# Patient Record
Sex: Female | Born: 1975 | Race: White | Hispanic: No | Marital: Married | Smoking: Former smoker
Health system: Southern US, Community
[De-identification: ages and names within clinical notes are randomized; demographics above are authoritative.]

## PROBLEM LIST (undated history)

## (undated) DIAGNOSIS — Z789 Other specified health status: Secondary | ICD-10-CM

## (undated) DIAGNOSIS — R748 Abnormal levels of other serum enzymes: Secondary | ICD-10-CM

## (undated) DIAGNOSIS — IMO0002 Reserved for concepts with insufficient information to code with codable children: Secondary | ICD-10-CM

## (undated) HISTORY — PX: OTHER SURGICAL HISTORY: SHX169

## (undated) HISTORY — PX: CHOLECYSTECTOMY, LAPAROSCOPIC: SHX56

## (undated) HISTORY — PX: LEEP: SHX91

## (undated) HISTORY — PX: CERVICAL BIOPSY  W/ LOOP ELECTRODE EXCISION: SUR135

## (undated) HISTORY — DX: Abnormal levels of other serum enzymes: R74.8

---
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
## 1997-05-13 HISTORY — PX: TUBAL LIGATION: SHX77

## 1997-07-04 ENCOUNTER — Ambulatory Visit (HOSPITAL_COMMUNITY): Admission: RE | Admit: 1997-07-04 | Discharge: 1997-07-04 | Payer: Self-pay | Admitting: *Deleted

## 1997-07-10 ENCOUNTER — Inpatient Hospital Stay (HOSPITAL_COMMUNITY): Admission: AD | Admit: 1997-07-10 | Discharge: 1997-07-10 | Payer: Self-pay | Admitting: *Deleted

## 1997-07-24 ENCOUNTER — Inpatient Hospital Stay (HOSPITAL_COMMUNITY): Admission: AD | Admit: 1997-07-24 | Discharge: 1997-07-24 | Payer: Self-pay | Admitting: *Deleted

## 1997-07-27 ENCOUNTER — Ambulatory Visit (HOSPITAL_COMMUNITY): Admission: RE | Admit: 1997-07-27 | Discharge: 1997-07-27 | Payer: Self-pay | Admitting: *Deleted

## 1997-07-29 ENCOUNTER — Inpatient Hospital Stay (HOSPITAL_COMMUNITY): Admission: AD | Admit: 1997-07-29 | Discharge: 1997-08-01 | Payer: Self-pay | Admitting: Obstetrics & Gynecology

## 1997-09-12 ENCOUNTER — Encounter: Admission: RE | Admit: 1997-09-12 | Discharge: 1997-09-12 | Payer: Self-pay | Admitting: Family Medicine

## 1998-11-17 ENCOUNTER — Encounter: Admission: RE | Admit: 1998-11-17 | Discharge: 1998-11-17 | Payer: Self-pay | Admitting: Family Medicine

## 2004-05-13 HISTORY — PX: CHOLECYSTECTOMY: SHX55

## 2005-04-12 ENCOUNTER — Emergency Department (HOSPITAL_COMMUNITY): Admission: EM | Admit: 2005-04-12 | Discharge: 2005-04-12 | Payer: Self-pay | Admitting: Emergency Medicine

## 2005-04-18 ENCOUNTER — Encounter (INDEPENDENT_AMBULATORY_CARE_PROVIDER_SITE_OTHER): Payer: Self-pay | Admitting: Specialist

## 2005-04-18 ENCOUNTER — Ambulatory Visit (HOSPITAL_COMMUNITY): Admission: AD | Admit: 2005-04-18 | Discharge: 2005-04-19 | Payer: Self-pay | Admitting: General Surgery

## 2005-05-13 DIAGNOSIS — IMO0002 Reserved for concepts with insufficient information to code with codable children: Secondary | ICD-10-CM

## 2005-05-13 HISTORY — DX: Reserved for concepts with insufficient information to code with codable children: IMO0002

## 2005-09-09 ENCOUNTER — Ambulatory Visit (HOSPITAL_COMMUNITY): Admission: RE | Admit: 2005-09-09 | Discharge: 2005-09-09 | Payer: Self-pay | Admitting: Obstetrics and Gynecology

## 2006-07-17 ENCOUNTER — Other Ambulatory Visit: Admission: RE | Admit: 2006-07-17 | Discharge: 2006-07-17 | Payer: Self-pay | Admitting: Gynecology

## 2007-05-08 IMAGING — RF DG CHOLANGIOGRAM OPERATIVE
1 series · 4 of 4 positions shown · non-contrast
Comparison: none

CLINICAL DATA: Cholelithiasis

[Series 1: run · 4 of 74 frames shown]
[frame 12/74]
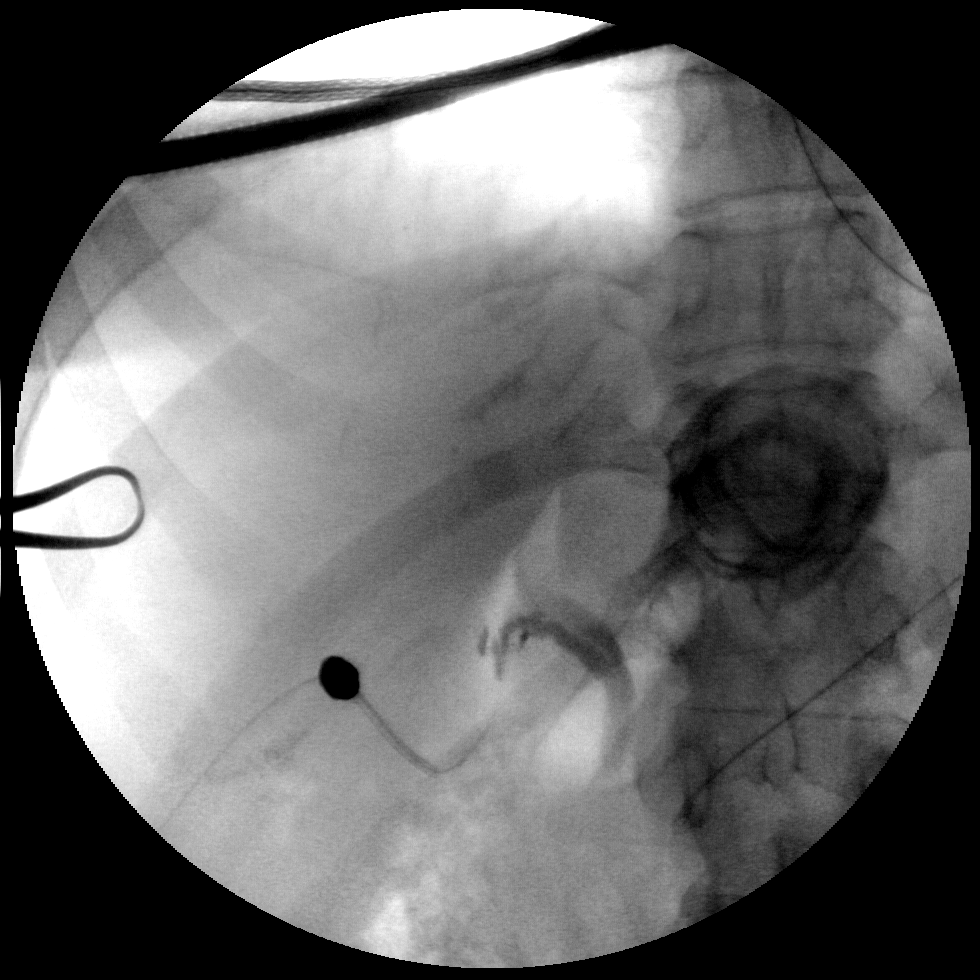
[frame 28/74]
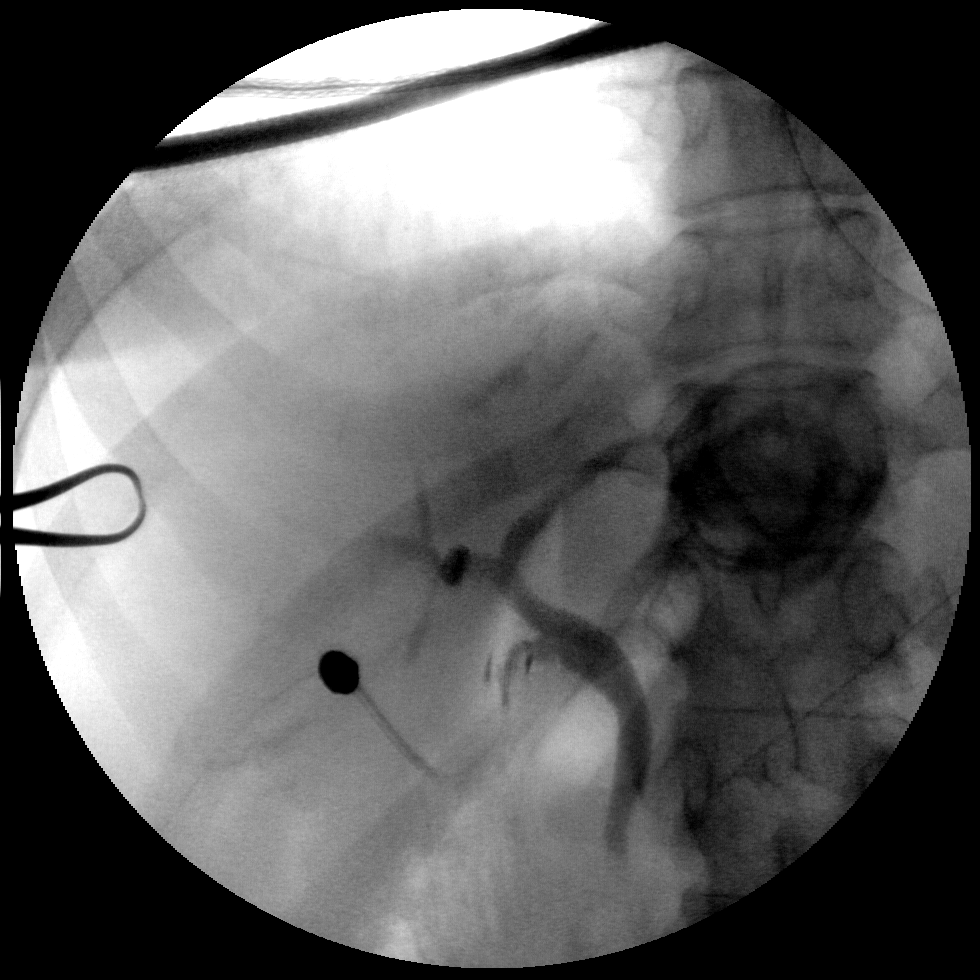
[frame 38/74]
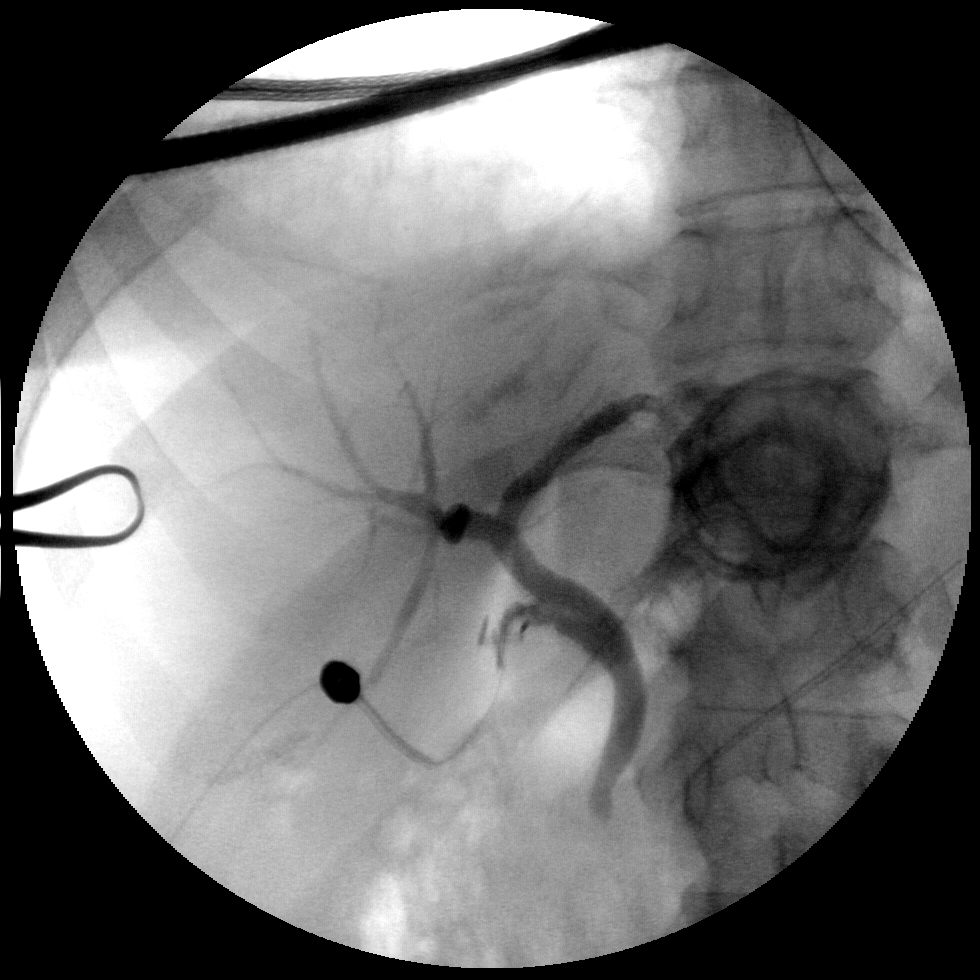
[frame 63/74]
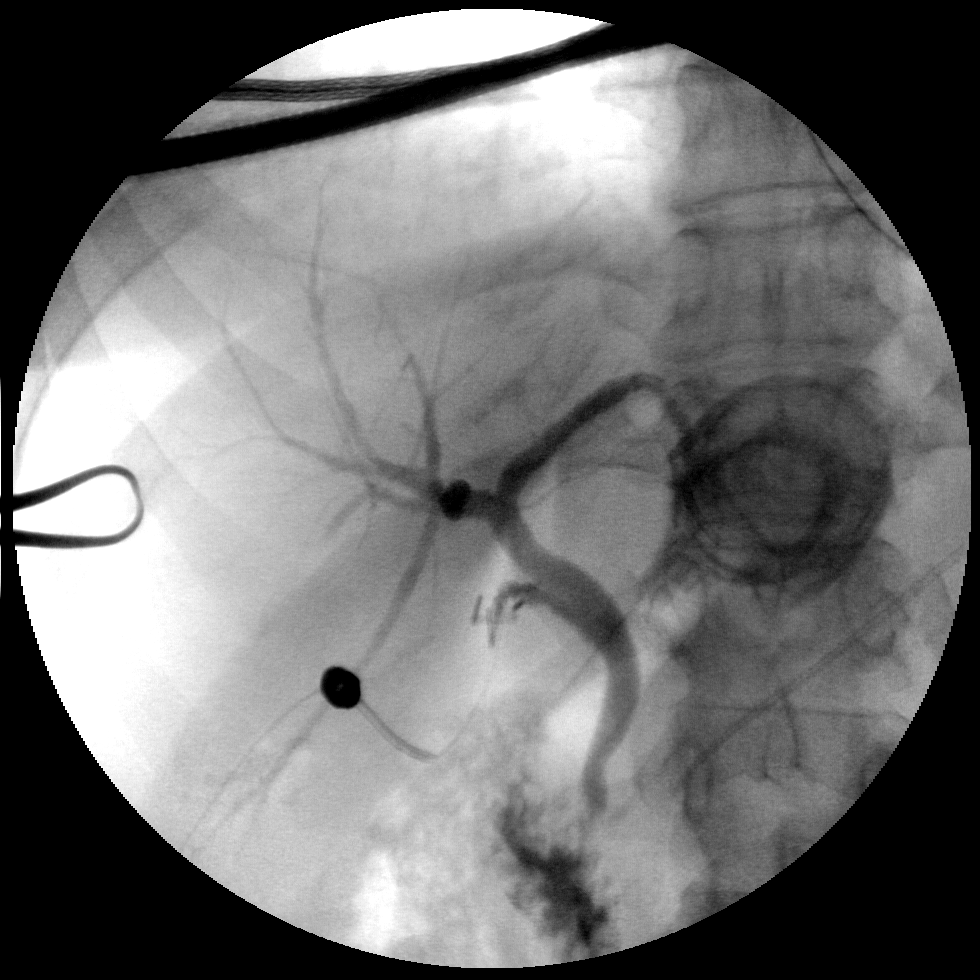

[4 of 4 positions shown; findings below may reference images not displayed]

INTRAOPERATIVE CHOLANGIOGRAM:

74  images from intraoperative C-arm fluoroscopy demonstrate  opacification of
the common bile duct. No filling defects to suggest retained stones. There is
incomplete evaluation of intrahepatic biliary tree, which appears decompressed
centrally. Contrast appears to flow on into decompressed duodenum.
IMPRESSION: 1. Negative for retained common duct stone

## 2007-06-05 ENCOUNTER — Inpatient Hospital Stay (HOSPITAL_COMMUNITY): Admission: AD | Admit: 2007-06-05 | Discharge: 2007-06-05 | Payer: Self-pay | Admitting: Obstetrics and Gynecology

## 2007-06-15 ENCOUNTER — Ambulatory Visit: Payer: Self-pay | Admitting: Cardiology

## 2007-06-15 LAB — CONVERTED CEMR LAB
Basophils Relative: 0 % (ref 0.0–1.0)
Eosinophils Relative: 0 % (ref 0.0–5.0)
HCT: 28.8 % — ABNORMAL LOW (ref 36.0–46.0)
Hemoglobin: 9 g/dL — ABNORMAL LOW (ref 12.0–15.0)
Lymphocytes Relative: 34 % (ref 12.0–46.0)
Neutrophils Relative %: 59 % (ref 43.0–77.0)
RDW: 14.9 % — ABNORMAL HIGH (ref 11.5–14.6)
TSH: 0.55 microintl units/mL (ref 0.35–5.50)
WBC: 12.2 10*3/uL — ABNORMAL HIGH (ref 4.5–10.5)

## 2007-06-18 ENCOUNTER — Inpatient Hospital Stay (HOSPITAL_COMMUNITY): Admission: AD | Admit: 2007-06-18 | Discharge: 2007-06-18 | Payer: Self-pay | Admitting: Obstetrics & Gynecology

## 2007-07-07 ENCOUNTER — Inpatient Hospital Stay (HOSPITAL_COMMUNITY): Admission: AD | Admit: 2007-07-07 | Discharge: 2007-07-10 | Payer: Self-pay | Admitting: Obstetrics & Gynecology

## 2010-02-26 ENCOUNTER — Other Ambulatory Visit: Admission: RE | Admit: 2010-02-26 | Discharge: 2010-02-26 | Payer: Self-pay | Admitting: Gynecology

## 2010-02-26 ENCOUNTER — Ambulatory Visit: Payer: Self-pay | Admitting: Gynecology

## 2010-08-03 ENCOUNTER — Ambulatory Visit (INDEPENDENT_AMBULATORY_CARE_PROVIDER_SITE_OTHER): Payer: BC Managed Care – PPO | Admitting: Gynecology

## 2010-08-03 DIAGNOSIS — N912 Amenorrhea, unspecified: Secondary | ICD-10-CM

## 2010-08-03 DIAGNOSIS — O26849 Uterine size-date discrepancy, unspecified trimester: Secondary | ICD-10-CM

## 2010-08-14 LAB — HIV ANTIBODY (ROUTINE TESTING W REFLEX)
HIV: NONREACTIVE
HIV: NONREACTIVE

## 2010-08-14 LAB — RPR: RPR: NONREACTIVE

## 2010-08-14 LAB — RUBELLA ANTIBODY, IGM: Rubella: IMMUNE

## 2010-08-14 LAB — ABO/RH

## 2010-08-14 LAB — ANTIBODY SCREEN: Antibody Screen: NEGATIVE

## 2010-09-25 NOTE — Assessment & Plan Note (Signed)
[facility]                            CARDIOLOGY OFFICE NOTE   NAME:[name], [name]                            MRN:          [id number]  DATE:06/15/2007                            DOB:          1975/12/01    I was asked by Dr. Edward Jolly to consult on [name] for tachycardia  and abnormal EKG.   She was having some shortness of breath and lower extremity edema and  was seen at [facility] on June 05, 2007.   She had an EKG which showed sinus tachycardia with poor progression in  the anterior precordium suggestive of an anterior wall infarct or old  inferior wall infarct.   She denies any chest pain but has had some dyspnea on exertion.  She  denies any true orthopnea.  She has a lot of edema at the end of the day  that goes down at night.   She is about 34 weeks' pregnant.  This is her fourth child.  She has had  no previous complications.  She has had no previous cardiac disease.   PAST MEDICAL HISTORY:  SHE IS INTOLERANT OF PERCOCET AND PENICILLIN.  She quit smoking in January of 2007.  She does not use any recreational  products or drink any alcohol.   PAST SURGICAL HISTORY:  1. Cholecystectomy in 2006.  2. Breast lumpectomy in 1996.  3. Tubal ligation 1999.  4. Tubal reversible in 2005.   FAMILY HISTORY:  Negative for premature coronary disease.   CURRENT MEDICATIONS:  Are none.   She is married and has 4 children.  Her husband is with her today.  She  is a Occupational hygienist doing data entry, filing, faxing, Spanish  translation.   REVIEW OF SYSTEMS:  Other than in the HPI is negative.   Her blood pressure is 122/70, heart rate is 116 and regular.  Her weight  is 225.  She is 5 feet, very pleasant.  HEENT:  Normocephalic, atraumatic.  PERRL.  Extraocular movements  intact.  Sclerae are clear.  Facial symmetry normal.  NECK:  Is supple.  Thyroid is not enlarged or tender.  Carotid upstrokes  were equal bilaterally  without bruits.  There was no obvious JVD.  LUNGS:  Were clear to auscultation.  HEART:  Reveals a poorly appreciated PMI.  She has a normal S1-S2 with  no gallop.  ABDOMINAL EXAM:  Is gravida.  EXTREMITIES:  Reveal trace edema.  Pulses are intact.  NEUROLOGIC EXAM:  Is intact.   ASSESSMENT:  1. Resting tachycardia.  Rule out significant anemia, thyroid      abnormality or just physiological changes of pregnancy.  2. Abnormal EKG.  This may just be from clockwise rotation of the      heart from diaphragmatic compression from pregnancy.  She needs a 2-      D echocardiogram particularly with her history of shortness of      breath and lower extremity edema.   PLAN:  1. Check CBC, TSH.  2. Two-D echocardiogram.   I will see her back  assuming her tests are relatively normal,  particularly her echocardiogram, several weeks after delivery.     Thomas C. Daleen Squibb, MD, [facility]  Electronically Signed    TCW/MedQ  DD: 06/15/2007  DT: 06/16/2007  Job #: [id number]   cc:   Randye Lobo, M.D.

## 2010-09-28 NOTE — Op Note (Signed)
[name], [name]                 ACCOUNT NO.:  [id number]   MEDICAL RECORD NO.:  [id number]          PATIENT TYPE:  OIB   LOCATION:  1609                         FACILITY:  [facility]   PHYSICIAN:  Ollen Gross. Vernell Morgans, M.D. DATE OF BIRTH:  [birth year]   DATE OF PROCEDURE:  04/18/2005  DATE OF DISCHARGE:  04/19/2005                                 OPERATIVE REPORT   PREOPERATIVE DIAGNOSIS:  Gallstones.   POSTOPERATIVE DIAGNOSIS:  Gallstones.   PROCEDURES:  Laparoscopic cholecystectomy with intraoperative cholangiogram.   SURGEON:  Dr. Carolynne Edouard.   ASSISTANT:  Dr. Jamey Ripa.   ANESTHESIA:  General endotracheal.   PROCEDURE:  After informed consent was obtained, the patient was brought to  the operating room, placed in supine position on the table. After adequate  induction of general anesthesia, the patient's abdomen was prepped with  Betadine and draped in the usual sterile manner. The area below the  umbilicus was infiltrated with 0.25% Marcaine, a small incision was made  with a 15 blade knife. This incision was carried down through the  subcutaneous tissue bluntly with a hemostat and Army-Nav[employer] retractors until  the linea alba was identified. The linea alba was incised with a15 blade  knife and each side was grasped with Kocher clamps and elevated anteriorly.  The preperitoneal space was probed bluntly with a hemostat until the  peritoneum was and opened access was gained to the abdominal cavity. A #0  Vicryl pursestring stitch was placed in the fascia surrounding the opening,  a Hasson cannula was placed through the opening and anchored in place with  the previously placed Vicryl pursestring stitch. The abdomen was then  insufflated with carbon dioxide without difficulty. The patient was placed  in reverse Trendelenburg and rotated slightly with the right side up. Next  the laparoscope was inserted through the Hasson cannula and the right upper  quadrant was inspected. The dome of the  gallbladder and liver were readily  identified. Next the epigastric region was infiltrated with 0.25% Marcaine.  A small incision was made with a 15 blade knife. A 10 mm port was then  placed bluntly through this incision into the abdominal cavity under direct  vision. Sites were then chosen laterally on the right side of the abdomen  for placement of the 5 mm ports. Each of these areas was infiltrated with  0.25% Marcaine. Small stab incisions were made with a 15 blade knife and 5  mm ports were placed bluntly through these incisions into the abdominal  cavity under direct vision. A blunt grasper was placed through the lateral  most 5 mm port and used to grasp the dome of the gallbladder and elevate it  anteriorly and superiorly. Another blunt grasper was placed through the  other 5 mm port and used to retract on the body and neck of the gallbladder.  A dissector was placed through the epigastric port and using the  electrocautery, the peritoneal reflection at the gallbladder neck was  opened, blunt dissection was then carried out in this area until the  gallbladder neck cystic duct junction was  readily identified and a good  window was created. A single clip was placed on the gallbladder neck, a  small ductotomy was made just below the clip with the laparoscopic scissors.  Next, a 14 gauge Angiocath was placed percutaneously through the anterior  abl wall under direct vision. A Reddick cholangiogram catheter was placed  through the Angiocath and flushed. The Reddick catheter was then placed  within the cystic duct and anchored in place with the clip, a cholangiogram  was obtained that showed no filling defects, good emptying into the duodenum  and adequate length on the cystic duct. The anchoring clip and catheters  were then removed from the patient and three clips were placed proximally on  the cystic duct and the duct was divided between the two sets of clips.  Posterior to this, the  cystic artery was identified and again dissected  bluntly in a circumferential manner until a good window was created. Two  clips were placed proximally and one distally in the artery and the artery  was divided between the two sets of clips. Next a laparoscopic hook cautery  device was used to separate the gallbladder from the liver bed. Prior to  completely detaching the gallbladder from the liver, the liver bed was  inspected and several small bleeding points were coagulated with the  electrocautery until the area was completely hemostatic. The gallbladder was  then detached the rest of the way from the liver bed without difficulty. A  laparoscopic bag was then inserted through the epigastric port. The  gallbladder was placed within the bag and the bag was sealed. The abdomen  was then irrigated with copious amounts of saline until the effluent was  clear. The laparoscope was then moved to the epigastric port and a  gallbladder grasper was placed through the Hasson cannula and used to grasp  the opening of the bag. The bag with the gallbladder was then removed  through the infraumbilical port without difficulty with the Hasson cannula.  The fascial defect was closed with the previously placed Vicryl pursestring  stitch as well as with another figure-of-eight #0 Vicryl stitch. The rest of  the ports were then removed under direct vision and were found to be  hemostatic. The gas was allowed to escape. The skin incisions were all  closed with interrupted 4-0 Monocryl subcuticular stitches. Benzoin and  Steri-Strips were applied. The patient tolerated the procedure well. At the  end of the case, all needle, sponge and instrument counts were correct. The  patient was then awakened and taken to recovery in stable condition.      Ollen Gross. Vernell Morgans, M.D.  Electronically Signed     PST/MEDQ  D:  04/21/2005  T:  04/22/2005  Job:  [id number]

## 2010-09-28 NOTE — H&P (Signed)
[name]                 ACCOUNT NO.:  [id number]   MEDICAL RECORD NO.:  [id number]          PATIENT TYPE:  AMB   LOCATION:  DAY                          FACILITY:  [facility]   PHYSICIAN:  Adolph Pollack, M.D.DATE OF BIRTH:  January 22, 1976   DATE OF ADMISSION:  04/18/2005  DATE OF DISCHARGE:                                HISTORY & PHYSICAL   CHIEF COMPLAINT:  Increasing right upper quadrant abdominal pain, chills and  sweats, and nausea.   HISTORY OF PRESENT ILLNESS:  Ms. [name] is an otherwise-healthy 35 year old  female who presented to the emergency room April 12, 2005, complaining of  severe right upper quadrant pain with nausea and vomiting. At that time she  was noted to have a gallstone impacted in the neck of the gallbladder. CBC  and liver function tests were normal, lipase was normal. No gallbladder wall  thickening to suggest cholecystitis. She was discharged on Dilaudid and told  to follow up in our office or the emergency department if symptoms got  worse. She has had worsening symptoms where now she is really unable to keep  much of anything down. She has had sweats and chills. She is feeling weak.  The Dilaudid is not managing her pain and she comes to our urgent office to  be seen.   PAST MEDICAL HISTORY:  Urinary tract infections.   PREVIOUS OPERATIONS:  1.  Bilateral tubal ligation.  2.  Reversal of bilateral tubal ligation.  3.  Breast biopsy.   ALLERGIES:  1.  PERCOCET causes a rash.  2.  PENICILLIN leads to swelling of her throat.   CURRENT MEDICATIONS:  Dilaudid every 4 hours p.r.n.   SOCIAL HISTORY:  She is married. Denies tobacco or alcohol use. Works for  the [employer].   REVIEW OF SYSTEMS:  CARDIOVASCULAR:  Denies heart disease or hypertension.  PULMONARY:  Denies any asthma, pneumonia, chronic lung disease. GI:  Denies  hepatitis, diarrhea, or bloody stool. GU:  Denies kidney stones but has had  multiple  urinary tract infections in the past, none over the past 7 years,  however. NEUROLOGIC:  No seizure disorders. ENDOCRINE:  Denies diabetes or  thyroid disease. Denies hypercholesterolemia. HEMATOLOGIC:  Denies any  bleeding disorders or blood clots.   PHYSICAL EXAMINATION:  GENERAL:  An ill-appearing female. She is about 5  feet 4 inches tall, weighs 163 pounds.  VITAL SIGNS:  Her temperature is 98.6, her pulse is 100, blood pressure is  126/80.  EYES:  Extraocular movements intact. No icterus is noted.  NECK:  Supple.  RESPIRATORY:  The breath sounds are equal and clear, respirations unlabored.  HEART:  Demonstrates a slightly increased rate with regular rhythm. I do not  hear a murmur.  ABDOMEN:  Soft but there is right upper quadrant tenderness and guarding and  a positive Murphy's sign. No obvious masses.  EXTREMITIES:  No cyanosis or edema.   IMPRESSION:  1.  Evolving acute cholecystitis.  2.  Dehydration - she states her urine is fairly dark with a strong,  concentrated odor to it. Not really having dysuria, however. Urinalysis      suggested many bacteria but also many squamous cells and only a few      white blood cells and not really consistent with a UTI back on December      1.   PLAN:  We will admit her to the hospital. Plan to start IV antibiotics. Plan  for a laparoscopic, possible open cholecystectomy. I discussed the procedure  and the risks to her. The risks include but are not limited to bleeding,  infection, wound healing problems, anesthetic risks, accidental injury of  the common bile duct/liver/intestines, bile leak, post cholecystectomy  diarrhea. She seems to understand this and agrees to proceed.      Adolph Pollack, M.D.  Electronically Signed     TJR/MEDQ  D:  04/18/2005  T:  04/18/2005  Job:  [id number]

## 2011-01-02 ENCOUNTER — Inpatient Hospital Stay (HOSPITAL_COMMUNITY)
Admission: AD | Admit: 2011-01-02 | Discharge: 2011-01-02 | Disposition: A | Discharge status: Home / Self Care | Payer: BC Managed Care – PPO | Source: Ambulatory Visit | Attending: Obstetrics and Gynecology | Admitting: Obstetrics and Gynecology

## 2011-01-02 ENCOUNTER — Encounter (HOSPITAL_COMMUNITY): Payer: Self-pay | Admitting: *Deleted

## 2011-01-02 DIAGNOSIS — O4700 False labor before 37 completed weeks of gestation, unspecified trimester: Secondary | ICD-10-CM | POA: Insufficient documentation

## 2011-01-02 HISTORY — DX: Reserved for concepts with insufficient information to code with codable children: IMO0002

## 2011-01-02 LAB — POCT FERN TEST: Fern Test: NEGATIVE

## 2011-01-02 NOTE — ED Provider Notes (Signed)
History     Chief Complaint  Patient presents with  . Contractions   HPI  [name], 35 y.o.  Patient of Dr. Berenda Morale presents with complaint of contractions.  Has hx of all 4 deliveries at 36 weeks.  Contractions began at 5:15pm, had a gush of fluid but no leaking since.  She is now [redacted]w[redacted]d.  Denies vaginal bleeding.  Contractions started q58minutes but now closer.  Reports uncomplicated pregnancy.   Stopped work, difficult to walk, on 8/3.    OB History    Grav Para Term Preterm Abortions TAB SAB Ect Mult Living   5 4 0 4 0 0 0 0 0 4       Past Medical History  Diagnosis Date  . Pregnancy induced hypertension 1996    G2  . Preterm labor   . Abnormal Pap smear 2007    colpo, leep    Past Surgical History  Procedure Date  . Breast biopsy 1996    left breast  . Cholecystectomy 2006  . Tubal ligation 1999    tubal reversal in 2006    No family history on file.  History  Substance Use Topics  . Smoking status: Never Smoker   . Smokeless tobacco: Never Used  . Alcohol Use: No    Allergies:  Allergies  Allergen Reactions  . Penicillins Swelling  . Percocet (Oxycodone-Acetaminophen) Other (See Comments)    migrains    No prescriptions prior to admission    Review of Systems  Constitutional: Negative for fever and chills.  Gastrointestinal: Negative for nausea and vomiting.  Genitourinary:       Contractions.  ? Loss of fluid   Physical Exam   Blood pressure 150/91, pulse 111, temperature 98.5 F (36.9 C), temperature source Oral, resp. rate 20, height 5\' 5"  (1.651 m), weight 244 lb (110.678 kg).  Physical Exam  Constitutional: She is oriented to person, place, and time. She appears well-developed and well-nourished.  GI:       gravid  Genitourinary: Vagina normal.       Cervical exam by Erin, Rn--1.5cm dilated, long -3.  Vaginal canal without blood or pooling of fluid.  Nl appearing white discharge without odor.   Neurological: She is alert and  oriented to person, place, and time.    MAU Course  Procedures  Slide for ferning obtained.  It is NEGATIVE>    FMS Reactive.  155, Mod variability.  Contracting q2-3 minutes apart.    MDM Erin to report findings to Dr. Ambrose Mantle.   Assessment and Plan    Sabrea Sankey,EVE M 01/02/2011, 8:44 PM

## 2011-01-02 NOTE — Progress Notes (Signed)
Pt in c/o contractions q6 minutes since 1715.  States she took a shower around 1700, had a warm gush of fluid when she got in shower.  Unsure if water broke.  Denies any leaking of fluid since then.  Denies any bleeding.  + FM.

## 2011-01-18 ENCOUNTER — Other Ambulatory Visit (HOSPITAL_COMMUNITY): Payer: Self-pay | Admitting: Obstetrics and Gynecology

## 2011-01-24 ENCOUNTER — Ambulatory Visit (HOSPITAL_COMMUNITY)
Admission: RE | Admit: 2011-01-24 | Discharge: 2011-01-24 | Disposition: A | Discharge status: Home / Self Care | Payer: BC Managed Care – PPO | Source: Ambulatory Visit | Attending: Obstetrics and Gynecology | Admitting: Obstetrics and Gynecology

## 2011-01-24 DIAGNOSIS — O3440 Maternal care for other abnormalities of cervix, unspecified trimester: Secondary | ICD-10-CM | POA: Insufficient documentation

## 2011-01-24 DIAGNOSIS — O3660X0 Maternal care for excessive fetal growth, unspecified trimester, not applicable or unspecified: Secondary | ICD-10-CM | POA: Insufficient documentation

## 2011-01-24 DIAGNOSIS — Z8751 Personal history of pre-term labor: Secondary | ICD-10-CM | POA: Insufficient documentation

## 2011-01-24 DIAGNOSIS — O409XX0 Polyhydramnios, unspecified trimester, not applicable or unspecified: Secondary | ICD-10-CM | POA: Insufficient documentation

## 2011-01-24 DIAGNOSIS — O09529 Supervision of elderly multigravida, unspecified trimester: Secondary | ICD-10-CM | POA: Insufficient documentation

## 2011-01-28 NOTE — H&P (Addendum)
[name] is a 35 y.o. female G5 P2204 at 39+ weeks gestation (EDD 02/04/11 by LMP c/w 14 week [place]) presents for a scheduled primary c-section by her request given a finding of probable macrosomia on [place] 01/24/11.  EFW on that scan was 4144 grams.  Pt had a prior difficult delivery of a 9#5oz infant with a fourth degree laceration and some residual rectal sphincter weakness and declines TOL as she feels this child is bigger.  Shje also wishes to have a tubal ligation for permanenet sterilization (she has had a BTL and reversal in the past).  Prenatal care has been complicated by polyhjydramnios, NST's done weekly.  History OB History    Grav Para Term Preterm Abortions TAB SAB Ect Mult Living   5 4 0 4 0 0 0 0 0 4     1995 NSVD 7#11oz 1996 NSVD 9#5oz 1999 NSVD 5#11oz 36 weeks 2009 NSVD 8#5oz  Past Medical History  Diagnosis Date  . Pregnancy induced hypertension 1996    G2  . Preterm labor   . Abnormal Pap smear 2007    colpo, leep   Past Surgical History  Procedure Date  . Breast biopsy 1996    left breast  . Cholecystectomy 2006  . Tubal ligation 1999    tubal reversal in 2006   Family History: in prenatals Social History:  reports that she has never smoked. She has never used smokeless tobacco. She reports that she does not drink alcohol or use illicit drugs.  ROS negative   BP 140/80 Maternal Exam:  Abdomen: Patient reports no abdominal tenderness. Surgical scars: low transverse.   Fundal height is 46cm.   Estimated fetal weight is 9 1/2-10 lbs.   Fetal presentation: vertex  Introitus: Normal vulva. Normal vagina.    Physical Exam  Constitutional: She appears well-developed.  Cardiovascular: Normal rate and regular rhythm.   Respiratory: Effort normal and breath sounds normal.  GI: Soft. Bowel sounds are normal.  Genitourinary: Vagina normal and uterus normal.  Psychiatric: Her behavior is normal.    Prenatal labs: ABO, Rh: A/Positive/-- (04/03 0000) Antibody:  Negative (04/03 0000) Rubella:  Immune RPR: Nonreactive (04/03 0000)  HBsAg: Negative (04/03 0000)  HIV: Non-reactive, Non-reactive (04/03 0000)  GBS: Negative (08/16 0000)  One hour GTT 154 3 hour glucola WNL Pt declined genetic screens  Assessment/Plan: Pt has elected a primary C-section for presumed macrosomia and a prior difficult delivery of a 9 lb infant.  She is aware of the possible 20% discrepancy in [place] projected and actual birth weight, but feels this child is larger than 9#.  SHe has a prior laparotomy scar from her tubal reversal.  We discussed the possible risks of bleeding infection and possible damage to bowel and bladder.  SHe also wishes permanent sterilization with a h/o BTL and reversal in the past.  We discussed if the tubes are too short, I may just have to do a fimbriectomynand is ok with that plan.  We discussed a possible failure rate of 1/100.  She desires to proceed.   Daveion Robar W 01/28/2011, 1:47 PM    Per pt no changes in dictated H&P. Huel Cote

## 2011-01-29 ENCOUNTER — Encounter (HOSPITAL_COMMUNITY): Payer: Self-pay

## 2011-01-29 ENCOUNTER — Telehealth (HOSPITAL_COMMUNITY): Payer: Self-pay | Admitting: *Deleted

## 2011-01-29 ENCOUNTER — Encounter (HOSPITAL_COMMUNITY)
Admission: RE | Admit: 2011-01-29 | Discharge: 2011-01-29 | Disposition: A | Discharge status: Home / Self Care | Payer: BC Managed Care – PPO | Source: Ambulatory Visit | Attending: Obstetrics and Gynecology | Admitting: Obstetrics and Gynecology

## 2011-01-29 HISTORY — DX: Other specified health status: Z78.9

## 2011-01-29 LAB — CBC
MCH: 22.7 pg — ABNORMAL LOW (ref 26.0–34.0)
MCV: 75.2 fL — ABNORMAL LOW (ref 78.0–100.0)
Platelets: 296 10*3/uL (ref 150–400)
RBC: 4.15 MIL/uL (ref 3.87–5.11)
RDW: 16.6 % — ABNORMAL HIGH (ref 11.5–15.5)
WBC: 11.9 10*3/uL — ABNORMAL HIGH (ref 4.0–10.5)

## 2011-01-29 LAB — SURGICAL PCR SCREEN: Staphylococcus aureus: NEGATIVE

## 2011-01-29 LAB — RPR: RPR Ser Ql: NONREACTIVE

## 2011-01-29 MED ORDER — GENTAMICIN SULFATE 40 MG/ML IJ SOLN
INTRAVENOUS | Status: AC
  Administered 2011-01-30: 100 mL via INTRAVENOUS
  Filled 2011-01-29: qty 2.5

## 2011-01-29 NOTE — Telephone Encounter (Signed)
Preadmission screen  

## 2011-01-29 NOTE — Patient Instructions (Signed)
   Your procedure is scheduled on:  Enter through the Main Entrance of [facility] at: Pick up the phone at the desk and dial [phone]  Please call this number if you have any problems the morning of surgery: [phone]  Remember: Do not eat food after midnight  Do not drink clear liquids after:midnight Take these medicines the morning of surgery with a SIP OF WATER:none  Do not wear jewelry, make-up, or FINGER nail polish Do not wear lotions, powders, or perfumes. Do not shave 48 hours prior to surgery. Do not bring valuables to the hospital. Leave suitcase in the car. After Surgery it may be brought to your room. For patients being admitted to the hospital, checkout time is 11:00am the day of discharge.  Patients discharged on the day of surgery will not be allowed to drive home.   Name and phone number of your driver:Jose- [phone]   Remember to use your hibiclens as instructed.

## 2011-01-30 ENCOUNTER — Encounter (HOSPITAL_COMMUNITY): Payer: Self-pay | Admitting: Anesthesiology

## 2011-01-30 ENCOUNTER — Inpatient Hospital Stay (HOSPITAL_COMMUNITY): Payer: BC Managed Care – PPO | Admitting: Anesthesiology

## 2011-01-30 ENCOUNTER — Inpatient Hospital Stay (HOSPITAL_COMMUNITY)
Admission: RE | Admit: 2011-01-30 | Discharge: 2011-02-02 | DRG: 371 | Disposition: A | Discharge status: Home / Self Care | Payer: BC Managed Care – PPO | Source: Ambulatory Visit | Attending: Obstetrics and Gynecology | Admitting: Obstetrics and Gynecology

## 2011-01-30 ENCOUNTER — Other Ambulatory Visit: Payer: Self-pay | Admitting: Obstetrics and Gynecology

## 2011-01-30 ENCOUNTER — Encounter (HOSPITAL_COMMUNITY)
Admission: RE | Disposition: A | Discharge status: Home / Self Care | Payer: Self-pay | Source: Ambulatory Visit | Attending: Obstetrics and Gynecology

## 2011-01-30 DIAGNOSIS — Z01818 Encounter for other preprocedural examination: Secondary | ICD-10-CM

## 2011-01-30 DIAGNOSIS — O3660X0 Maternal care for excessive fetal growth, unspecified trimester, not applicable or unspecified: Principal | ICD-10-CM | POA: Diagnosis present

## 2011-01-30 DIAGNOSIS — O409XX0 Polyhydramnios, unspecified trimester, not applicable or unspecified: Secondary | ICD-10-CM | POA: Diagnosis present

## 2011-01-30 DIAGNOSIS — Z302 Encounter for sterilization: Secondary | ICD-10-CM

## 2011-01-30 DIAGNOSIS — Z98891 History of uterine scar from previous surgery: Secondary | ICD-10-CM

## 2011-01-30 DIAGNOSIS — Z01812 Encounter for preprocedural laboratory examination: Secondary | ICD-10-CM

## 2011-01-30 DIAGNOSIS — O09529 Supervision of elderly multigravida, unspecified trimester: Secondary | ICD-10-CM | POA: Diagnosis present

## 2011-01-30 LAB — TYPE AND SCREEN
ABO/RH(D): A POS
Antibody Screen: NEGATIVE

## 2011-01-30 SURGERY — Surgical Case
Anesthesia: Spinal | Site: Abdomen | Wound class: Clean Contaminated

## 2011-01-30 MED ORDER — EPHEDRINE 5 MG/ML INJ
INTRAVENOUS | Status: AC
  Filled 2011-01-30: qty 10

## 2011-01-30 MED ORDER — ZOLPIDEM TARTRATE 5 MG PO TABS: 5.0000 mg | ORAL_TABLET | Freq: Every evening | ORAL | Status: DC | PRN

## 2011-01-30 MED ORDER — ONDANSETRON HCL 4 MG/2ML IJ SOLN: 4.0000 mg | INTRAMUSCULAR | Status: DC | PRN

## 2011-01-30 MED ORDER — PHENYLEPHRINE HCL 10 MG/ML IJ SOLN
INTRAMUSCULAR | Status: DC | PRN
  Administered 2011-01-30 (×3): 40 ug via INTRAVENOUS
  Administered 2011-01-30: 80 ug via INTRAVENOUS
  Administered 2011-01-30: 40 ug via INTRAVENOUS
  Administered 2011-01-30: 80 ug via INTRAVENOUS
  Administered 2011-01-30 (×4): 40 ug via INTRAVENOUS
  Administered 2011-01-30: 80 ug via INTRAVENOUS
  Administered 2011-01-30: 40 ug via INTRAVENOUS

## 2011-01-30 MED ORDER — OXYTOCIN 20 UNITS IN LACTATED RINGERS INFUSION - SIMPLE: 125.0000 mL/h | INTRAVENOUS | Status: AC

## 2011-01-30 MED ORDER — PRENATAL PLUS 27-1 MG PO TABS
1.0000 | ORAL_TABLET | Freq: Every day | ORAL | Status: DC
  Administered 2011-01-31 – 2011-02-01 (×2): 1 via ORAL
  Filled 2011-01-30 (×2): qty 1

## 2011-01-30 MED ORDER — OXYTOCIN 10 UNIT/ML IJ SOLN
INTRAMUSCULAR | Status: AC
  Filled 2011-01-30: qty 4

## 2011-01-30 MED ORDER — SENNOSIDES-DOCUSATE SODIUM 8.6-50 MG PO TABS
2.0000 | ORAL_TABLET | Freq: Every day | ORAL | Status: DC
  Administered 2011-01-30 – 2011-02-01 (×3): 2 via ORAL

## 2011-01-30 MED ORDER — SCOPOLAMINE 1 MG/3DAYS TD PT72
MEDICATED_PATCH | TRANSDERMAL | Status: AC
  Administered 2011-01-30: 1.5 mg via TRANSDERMAL
  Filled 2011-01-30: qty 1

## 2011-01-30 MED ORDER — ONDANSETRON HCL 4 MG/2ML IJ SOLN
INTRAMUSCULAR | Status: DC | PRN
  Administered 2011-01-30: 4 mg via INTRAVENOUS

## 2011-01-30 MED ORDER — OXYTOCIN 10 UNIT/ML IJ SOLN
INTRAMUSCULAR | Status: AC
  Filled 2011-01-30: qty 2

## 2011-01-30 MED ORDER — MORPHINE SULFATE (PF) 0.5 MG/ML IJ SOLN
INTRAMUSCULAR | Status: DC | PRN
  Administered 2011-01-30: .1 mg via INTRATHECAL

## 2011-01-30 MED ORDER — ONDANSETRON HCL 4 MG PO TABS: 4.0000 mg | ORAL_TABLET | ORAL | Status: DC | PRN

## 2011-01-30 MED ORDER — OXYTOCIN 20 UNITS IN LACTATED RINGERS INFUSION - SIMPLE
INTRAVENOUS | Status: DC | PRN
  Administered 2011-01-30 (×2): 20 [IU] via INTRAVENOUS

## 2011-01-30 MED ORDER — FENTANYL CITRATE 0.05 MG/ML IJ SOLN
INTRAMUSCULAR | Status: AC
  Filled 2011-01-30: qty 2

## 2011-01-30 MED ORDER — SODIUM CHLORIDE 0.9 % IR SOLN
Status: DC | PRN
  Administered 2011-01-30: 1000 mL

## 2011-01-30 MED ORDER — TETANUS-DIPHTH-ACELL PERTUSSIS 5-2.5-18.5 LF-MCG/0.5 IM SUSP: 0.5000 mL | Freq: Once | INTRAMUSCULAR | Status: DC

## 2011-01-30 MED ORDER — LANOLIN HYDROUS EX OINT: 1.0000 "application " | TOPICAL_OINTMENT | CUTANEOUS | Status: DC | PRN

## 2011-01-30 MED ORDER — WITCH HAZEL-GLYCERIN EX PADS: 1.0000 "application " | MEDICATED_PAD | CUTANEOUS | Status: DC | PRN

## 2011-01-30 MED ORDER — DIPHENHYDRAMINE HCL 25 MG PO CAPS: 25.0000 mg | ORAL_CAPSULE | Freq: Four times a day (QID) | ORAL | Status: DC | PRN

## 2011-01-30 MED ORDER — LACTATED RINGERS IV SOLN
INTRAVENOUS | Status: DC
  Administered 2011-01-30: 12:00:00 via INTRAVENOUS

## 2011-01-30 MED ORDER — HYDROCODONE-ACETAMINOPHEN 5-325 MG PO TABS
1.0000 | ORAL_TABLET | ORAL | Status: DC | PRN
  Administered 2011-01-31 (×2): 1 via ORAL
  Filled 2011-01-30 (×2): qty 1

## 2011-01-30 MED ORDER — MORPHINE SULFATE 0.5 MG/ML IJ SOLN
INTRAMUSCULAR | Status: AC
  Filled 2011-01-30: qty 10

## 2011-01-30 MED ORDER — KETOROLAC TROMETHAMINE 30 MG/ML IJ SOLN
INTRAMUSCULAR | Status: AC
  Filled 2011-01-30: qty 1

## 2011-01-30 MED ORDER — BUPIVACAINE IN DEXTROSE 0.75-8.25 % IT SOLN
INTRATHECAL | Status: DC | PRN
  Administered 2011-01-30: 11 mg via INTRATHECAL

## 2011-01-30 MED ORDER — ONDANSETRON HCL 4 MG/2ML IJ SOLN
INTRAMUSCULAR | Status: AC
  Filled 2011-01-30: qty 2

## 2011-01-30 MED ORDER — SIMETHICONE 80 MG PO CHEW: 80.0000 mg | CHEWABLE_TABLET | ORAL | Status: DC | PRN

## 2011-01-30 MED ORDER — PHENYLEPHRINE 40 MCG/ML (10ML) SYRINGE FOR IV PUSH (FOR BLOOD PRESSURE SUPPORT)
PREFILLED_SYRINGE | INTRAVENOUS | Status: AC
  Filled 2011-01-30: qty 5

## 2011-01-30 MED ORDER — SCOPOLAMINE 1 MG/3DAYS TD PT72
1.0000 | MEDICATED_PATCH | Freq: Once | TRANSDERMAL | Status: DC
  Administered 2011-01-30: 1.5 mg via TRANSDERMAL

## 2011-01-30 MED ORDER — SIMETHICONE 80 MG PO CHEW
80.0000 mg | CHEWABLE_TABLET | Freq: Three times a day (TID) | ORAL | Status: DC
  Administered 2011-01-30 – 2011-02-02 (×10): 80 mg via ORAL

## 2011-01-30 MED ORDER — DIBUCAINE 1 % RE OINT: 1.0000 "application " | TOPICAL_OINTMENT | RECTAL | Status: DC | PRN

## 2011-01-30 MED ORDER — LACTATED RINGERS IV SOLN
INTRAVENOUS | Status: DC
  Administered 2011-01-30 (×6): via INTRAVENOUS

## 2011-01-30 MED ORDER — KETOROLAC TROMETHAMINE 30 MG/ML IJ SOLN
15.0000 mg | Freq: Once | INTRAMUSCULAR | Status: AC | PRN
  Administered 2011-01-30: 30 mg via INTRAVENOUS

## 2011-01-30 MED ORDER — FENTANYL CITRATE 0.05 MG/ML IJ SOLN
INTRAMUSCULAR | Status: DC | PRN
  Administered 2011-01-30: 25 ug via INTRATHECAL

## 2011-01-30 MED ORDER — PHENYLEPHRINE 40 MCG/ML (10ML) SYRINGE FOR IV PUSH (FOR BLOOD PRESSURE SUPPORT)
PREFILLED_SYRINGE | INTRAVENOUS | Status: AC
  Filled 2011-01-30: qty 15

## 2011-01-30 MED ORDER — MENTHOL 3 MG MT LOZG: 1.0000 | LOZENGE | OROMUCOSAL | Status: DC | PRN

## 2011-01-30 MED ORDER — IBUPROFEN 600 MG PO TABS
600.0000 mg | ORAL_TABLET | Freq: Four times a day (QID) | ORAL | Status: DC
  Administered 2011-01-30 – 2011-02-02 (×11): 600 mg via ORAL
  Filled 2011-01-30 (×11): qty 1

## 2011-01-30 SURGICAL SUPPLY — 30 items
CHLORAPREP W/TINT 26ML (MISCELLANEOUS) ×2 IMPLANT
CLOTH BEACON ORANGE TIMEOUT ST (SAFETY) ×2 IMPLANT
CONTAINER PREFILL 10% NBF 15ML (MISCELLANEOUS) IMPLANT
DRSG COVADERM 4X8 (GAUZE/BANDAGES/DRESSINGS) ×1 IMPLANT
ELECT REM PT RETURN 9FT ADLT (ELECTROSURGICAL) ×2
ELECTRODE REM PT RTRN 9FT ADLT (ELECTROSURGICAL) ×1 IMPLANT
EXTRACTOR VACUUM KIWI (MISCELLANEOUS) IMPLANT
EXTRACTOR VACUUM M CUP 4 TUBE (SUCTIONS) IMPLANT
GLOVE BIO SURGEON STRL SZ 6.5 (GLOVE) ×4 IMPLANT
GLOVE BIO SURGEON STRL SZ8 (GLOVE) ×2 IMPLANT
GOWN PREVENTION PLUS LG XLONG (DISPOSABLE) ×4 IMPLANT
KIT ABG SYR 3ML LUER SLIP (SYRINGE) IMPLANT
NDL HYPO 25X5/8 SAFETYGLIDE (NEEDLE) ×1 IMPLANT
NEEDLE HYPO 25X5/8 SAFETYGLIDE (NEEDLE) ×2 IMPLANT
NS IRRIG 1000ML POUR BTL (IV SOLUTION) ×2 IMPLANT
PACK C SECTION WH (CUSTOM PROCEDURE TRAY) ×2 IMPLANT
RTRCTR C-SECT PINK 25CM LRG (MISCELLANEOUS) ×2 IMPLANT
SLEEVE SCD COMPRESS KNEE MED (MISCELLANEOUS) IMPLANT
STAPLER VISISTAT 35W (STAPLE) IMPLANT
SUT CHROMIC 1 CTX 36 (SUTURE) ×4 IMPLANT
SUT PLAIN 0 NONE (SUTURE) IMPLANT
SUT PLAIN 2 0 XLH (SUTURE) IMPLANT
SUT VIC AB 0 CT1 27 (SUTURE) ×4
SUT VIC AB 0 CT1 27XBRD ANBCTR (SUTURE) ×2 IMPLANT
SUT VIC AB 2-0 CT1 27 (SUTURE)
SUT VIC AB 2-0 CT1 TAPERPNT 27 (SUTURE) IMPLANT
SUT VIC AB 4-0 KS 27 (SUTURE) IMPLANT
TOWEL OR 17X24 6PK STRL BLUE (TOWEL DISPOSABLE) ×4 IMPLANT
TRAY FOLEY CATH 14FR (SET/KITS/TRAYS/PACK) ×2 IMPLANT
WATER STERILE IRR 1000ML POUR (IV SOLUTION) ×2 IMPLANT

## 2011-01-30 NOTE — Anesthesia Postprocedure Evaluation (Signed)
Anesthesia Post Note  Patient: [name]  Procedure(s) Performed:  CESAREAN SECTION - Repeat with Bilateral Tubal Ligation   Anesthesia type: Spinal  Patient location: PACU  Post pain: Pain level controlled  Post assessment: Post-op Vital signs reviewed  Last Vitals:  Filed Vitals:   01/30/11 0657  BP: 140/88  Pulse: 105  Temp: 98.2 F (36.8 C)  Resp: 18    Post vital signs: Reviewed  Level of consciousness: awake  Complications: No apparent anesthesia complications

## 2011-01-30 NOTE — Anesthesia Preprocedure Evaluation (Signed)
Anesthesia Evaluation  Name, MR# and DOB Patient awake  General Assessment Comment  Reviewed: Allergy & Precautions, H&P , Patient's Chart, lab work & pertinent test results  Airway Mallampati: III TM Distance: >3 FB Neck ROM: full    Dental No notable dental hx.    Pulmonary  clear to auscultation  pulmonary exam normalPulmonary Exam Normal breath sounds clear to auscultation none    Cardiovascular regular Normal    Neuro/Psych Negative Neurological ROS  Negative Psych ROS  GI/Hepatic/Renal negative GI ROS  negative Liver ROS  negative Renal ROS        Endo/Other  Negative Endocrine ROS (+)   Morbid obesity  Abdominal   Musculoskeletal   Hematology negative hematology ROS (+)   Peds  Reproductive/Obstetrics (+) Pregnancy    Anesthesia Other Findings             Anesthesia Physical Anesthesia Plan  ASA: III  Anesthesia Plan: Epidural   Post-op Pain Management:    Induction:   Airway Management Planned:   Additional Equipment:   Intra-op Plan:   Post-operative Plan:   Informed Consent: I have reviewed the patients History and Physical, chart, labs and discussed the procedure including the risks, benefits and alternatives for the proposed anesthesia with the patient or authorized representative who has indicated his/her understanding and acceptance.     Plan Discussed with:   Anesthesia Plan Comments:         Anesthesia Quick Evaluation  

## 2011-01-30 NOTE — Addendum Note (Signed)
Addendum  created 01/30/11 1724 by Fanny Dance   Modules edited:Notes Section

## 2011-01-30 NOTE — Op Note (Signed)
Operative note  Preop diagnosis Term pregnancy at 39+ weeks Suspected macrosomia Prior fourth degree laceration Prior tubal reversal and desire for permanent sterilization  Postop diagnosis Same  Procedure Primary low transverse C-section Bilateral tubal ligation in the Pomeroy method  Surgeon Dr. Huel Cote Dr. Tracey Harries  Anesthesia Spinal  Fluids Estimated blood loss  750 cc Urine output 100 cc clear urine IV fluid total of 5 L  Findings A viable female infant in the vertex presentation. Apgars were 8 and 9. Weight was 9 lbs. 3 oz. there was polyhydramnios noted. The ovaries and uterus were normal. The fallopian tubes appeared relatively normal with no significant adhesions and were relatively normal in length.  Specimen Placenta donated for research  Procedure note After informed consent was obtained the patient was taken to the operating room where spinal anesthesia was obtained without difficulty. An appropriate time out was performed and she was then prepped and draped in the normal sterile fashion in the dorsal supine position with a leftward tilt. Allis clamp test was performed and confirmed adequate anesthesia. With a Foley catheter in place a prior Pfannenstiel incision was then incised with the scalpel and carried through to underlying layer of fascia by sharp dissection and Bovie cautery. The fascia was then nicked in the midline and the incision extended laterally. The inferior aspect was grasped with Coker clamps elevated and dissected off the underlying rectus muscles. In a similar fashion the superior aspect was dissected off the rectus muscles. These were separated in the midline and the peritoneal cavity entered bluntly. Peritoneal incision was then extended both superiorly and inferiorly with careful attention to avoid both bowel bladder. The Alexis self-retaining retractor was then placed within the incision and the lower uterine segment exposed. The  lower uterine segment was then incised in a transverse fashion and the cavity entered bluntly. The membranes are ruptured with a large amount of amniotic fluid noted that was clear. The infant's head was then delivered atraumatically the nose and mouth bulb suctioned and the remainder of the body delivered without difficulty. Cord was clamped and cut and the infant was handed to the waiting pediatricians. The placenta was then spontaneously expressed and handed off for donation and the uterus cleared of all clots and debris with a moist lap sponge. The uterine incision was then closed the first a running locked layer of 0 chromic and the second imbricating layer of the same suture. There was an area of bleeding at the lower left aspect which required oversewing with 3-0 Vicryl in several interrupted figure-of-eight sutures. This obtained hemostasis and attention was turned to the left fallopian tube which was grasped in a Babcock clamp elevated and a 2-3 cm buckle of the tube tied off with 2 free ties of 0 plain suture. This segment was then amputated with the Metzenbaum scissors and handed off to pathology. The remaining pedicle was cauterized with Bovie cautery. All appeared hemostatic and this tube was returned to the abdomen. In a similar fashion, the right fallopian tube was elevated with a Babcock clamp and a 2-3 cm segment tied off with 2 free ties of 0 plain suture. This segment was then amputated and the free pedicle cauterized with Bovie cautery. Again all appeared hemostatic and this was returned to the abdomen. The uterine incision was once again inspected and found to be dry. Therefore the subfascial planes were inspected and hemostasis obtained with Bovie cautery. There was one area of bleeding on the lower right rectus muscle  which required a figure-of-eight suture of 3-0 Vicryl. The rectus and peritoneal layer were then reapproximated with several interrupted mattress sutures of 2-0 Vicryl the fascia  was then closed with 0 Vicryl in a running fashion. The subcutaneous tissue was reapproximated with 2-0 plain suture in a running fashion. The skin was closed with a subcuticular stitch of 3-0 Vicryl on a Keith needle as well as Steri-Strips and benzoin. All instrument and sponge counts were correct and the patient was taken to the recovery room in good condition the baby taken to the regular newborn nursery.

## 2011-01-30 NOTE — Anesthesia Postprocedure Evaluation (Signed)
  Anesthesia Post-op Note  Patient: [name]  Procedure(s) Performed:  CESAREAN SECTION - Repeat with Bilateral Tubal Ligation   Patient Location: PACU and Mother/Baby  Anesthesia Type: Spinal  Level of Consciousness: awake, alert  and oriented  Airway and Oxygen Therapy: Patient Spontanous Breathing  Post-op Pain: mild  Post-op Assessment: Patient's Cardiovascular Status Stable and Respiratory Function Stable  Post-op Vital Signs: stable  Complications: No apparent anesthesia complications

## 2011-01-30 NOTE — Anesthesia Procedure Notes (Signed)
Spinal Block  Patient location during procedure: OR Start time: 01/30/2011 8:24 AM Staffing Performed by: anesthesiologist  Preanesthetic Checklist Completed: patient identified, site marked, surgical consent, pre-op evaluation, timeout performed, IV checked, risks and benefits discussed and monitors and equipment checked Spinal Block Patient position: sitting Prep: DuraPrep Patient monitoring: heart rate, cardiac monitor, continuous pulse ox and blood pressure Approach: midline Location: L3-4 Injection technique: single-shot Needle Needle type: Sprotte  Needle gauge: 24 G Needle length: 9 cm Assessment Sensory level: T4 Additional Notes Patient identified.  Risk benefits discussed including failed block, incomplete pain control, headache, nerve damage, paralysis, blood pressure changes, nausea, vomiting, reactions to medication both toxic or allergic, and postpartum back pain.  Patient expressed understanding and wished to proceed.  All questions were answered.  Sterile technique used throughout procedure.  CSF was clear.  No parasthesia or other complications.  Please see nursing notes for vital signs.

## 2011-01-30 NOTE — Transfer of Care (Signed)
Immediate Anesthesia Transfer of Care Note  Patient: [name]  Procedure(s) Performed:  CESAREAN SECTION - Repeat with Bilateral Tubal Ligation   Patient Location: PACU  Anesthesia Type: Spinal  Level of Consciousness: awake, alert  and oriented  Airway & Oxygen Therapy: Patient Spontanous Breathing  Post-op Assessment: Report given to PACU RN and Post -op Vital signs reviewed and stable  Post vital signs: Reviewed and stable  Complications: No apparent anesthesia complications

## 2011-01-30 NOTE — Anesthesia Postprocedure Evaluation (Signed)
  Anesthesia Post-op Note  Patient: [name]  Procedure(s) Performed:  CESAREAN SECTION - Repeat with Bilateral Tubal Ligation   Patient Location: PACU and Mother/Baby  Anesthesia Type: Spinal  Level of Consciousness: awake, alert  and oriented  Airway and Oxygen Therapy: Patient Spontanous Breathing  Post-op Pain: mild  Post-op Assessment: Post-op Vital signs reviewed  Post-op Vital Signs: stable  Complications: No apparent anesthesia complications

## 2011-01-30 NOTE — Brief Op Note (Signed)
01/30/2011  9:53 AM  PATIENT:  [name]  35 y.o. female  PRE-OPERATIVE DIAGNOSIS:  Term pregnancy at 39+weeks, Suspected macrosomia,  Prior 4th degree lacaration,  desires sterilization   POST-OPERATIVE DIAGNOSIS:  same PROCEDURE:  Procedure(s): Primary Low Transverse CESAREAN SECTION Bilateral Tubal Ligation  SURGEON:  Surgeon(s): Angelica Pou, MD  ANESTHESIA:   spinal  OR FLUID I/O:  Total I/O In: 5000 [I.V.:5000] Out: 850 [Urine:100; Blood:750]  BLOOD ADMINISTERED:none  Findings  Viable female infant, Apgars 8,9 Vertex Weight 9#3oz.  Polyhydramnios.  Uterus WNL.  Ovaries WNL.  Tubes appeared normal s/p reversal--BTL done without difficulty.  SPECIMEN: Placenta DISPOSITION OF SPECIMEN: donated COUNTS:  YES  TOURNIQUET:  * No tourniquets in log *  DICTATION: .Dragon Dictation  PLAN OF CARE: Admit to inpatient   PATIENT DISPOSITION:  PACU - hemodynamically stable.

## 2011-01-31 ENCOUNTER — Inpatient Hospital Stay (HOSPITAL_COMMUNITY): Admission: RE | Admit: 2011-01-31 | Payer: BC Managed Care – PPO | Source: Ambulatory Visit

## 2011-01-31 LAB — CBC
HCT: 25.4 % — ABNORMAL LOW (ref 36.0–46.0)
Hemoglobin: 7.8 g/dL — ABNORMAL LOW (ref 12.0–15.0)
WBC: 13.7 10*3/uL — ABNORMAL HIGH (ref 4.0–10.5)

## 2011-01-31 MED ORDER — HYDROCODONE-ACETAMINOPHEN 5-325 MG PO TABS
2.0000 | ORAL_TABLET | Freq: Four times a day (QID) | ORAL | Status: DC | PRN
  Administered 2011-01-31 – 2011-02-02 (×5): 2 via ORAL
  Filled 2011-01-31 (×5): qty 2

## 2011-01-31 NOTE — Progress Notes (Signed)
Subjective: Postpartum Day1: Cesarean Delivery Patient reports tolerating PO and no problems voiding.  Ambulating with no dizziness or problem.  Lochia very light this AM.  Objective: Vital signs in last 24 hours: Temp:  [97.6 F (36.4 C)-98.8 F (37.1 C)] 98 F (36.7 C) (09/20 0900) Pulse Rate:  [86-129] 111  (09/20 0900) Resp:  [16-25] 18  (09/20 0900) BP: (102-140)/(59-84) 129/84 mmHg (09/20 0900) SpO2:  [96 %-99 %] 99 % (09/20 0900)  Physical Exam:  General: alert Lochia: appropriate Uterine Fundus: firm Incision: CDI    Basename 01/31/11 0515 01/29/11 1120  HGB 7.8* 9.4*  HCT 25.4* 31.2*    Assessment/Plan: Status post Cesarean section. Doing well postoperatively.  Continue current care, no sx from relative anemia.  Declines circumcision.  Oliver Pila 01/31/2011, 9:36 AM

## 2011-02-01 LAB — CBC
HCT: 28.2 — ABNORMAL LOW
Hemoglobin: 9.2 — ABNORMAL LOW
Hemoglobin: 9.2 — ABNORMAL LOW
MCHC: 33.9
MCV: 69.3 — ABNORMAL LOW
MCV: 70.2 — ABNORMAL LOW
Platelets: 295
RBC: 3.91
RDW: 16.7 — ABNORMAL HIGH
WBC: 13.6 — ABNORMAL HIGH
WBC: 18.3 — ABNORMAL HIGH

## 2011-02-01 LAB — RPR: RPR Ser Ql: NONREACTIVE

## 2011-02-01 NOTE — Progress Notes (Signed)
Subjective: Postpartum Day 1: Cesarean Delivery Patient reports tolerating PO and no problems voiding.  Does have a lot of LE swelling, no HA  Objective: Vital signs in last 24 hours: Temp:  [97.8 F (36.6 C)-98.2 F (36.8 C)] 97.8 F (36.6 C) (09/21 0600) Pulse Rate:  [100-114] 100  (09/21 0600) Resp:  [19-20] 20  (09/21 0600) BP: (113-150)/(77-93) 150/93 mmHg (09/21 0600)  Physical Exam:  General: alert Lochia: appropriate Uterine Fundus: firm Incision: healing well DVT Evaluation: No evidence of DVT seen on physical exam. 3+ edma   Basename 01/31/11 0515 01/29/11 1120  HGB 7.8* 9.4*  HCT 25.4* 31.2*    Assessment/Plan: Status post Cesarean section. Doing well postoperatively.  Continue current care. Plan d/c tomorrow One elevated BP, rest WNL--will follow but no s/s of preeclampsia except LE edema Mikeala Girdler W 02/01/2011, 9:47 AM

## 2011-02-02 MED ORDER — HYDROCODONE-ACETAMINOPHEN 5-325 MG PO TABS: 2.0000 | ORAL_TABLET | Freq: Four times a day (QID) | ORAL | Status: AC | PRN

## 2011-02-02 MED ORDER — INFLUENZA VIRUS VACC SPLIT PF IM SUSP: 0.5000 mL | Freq: Once | INTRAMUSCULAR | Status: DC

## 2011-02-02 NOTE — Discharge Summary (Signed)
Obstetric Discharge Summary Reason for Admission: cesarean section Prenatal Procedures: none Intrapartum Procedures: cesarean: low cervical, transverse Postpartum Procedures: none Complications-Operative and Postpartum: none Hemoglobin  Date Value Range Status  01/31/2011 7.8* 12.0-15.0 (g/dL) Final     DELTA CHECK NOTED     REPEATED TO VERIFY     HCT  Date Value Range Status  01/31/2011 25.4* 36.0-46.0 (%) Final    Discharge Diagnoses: Term Pregnancy-delivered  Discharge Information: Date: 02/02/2011 Activity: pelvic rest and no strenuous activity Diet: routine Medications: Ibuprophen and Vicodin Condition: stable Instructions: refer to practice specific booklet Discharge to: home Follow-up Information    Follow up with RICHARDSON,KATHY W. Make an appointment in 2 weeks.   Contact information:   [address], [address] [place] [address] [phone]          Newborn Data: Live born female  Birth Weight: 9 lb 3.1 oz (4170 g) APGAR: 8, 9  Home with mother.  [name] 02/02/2011, 8:43 AM

## 2011-02-12 ENCOUNTER — Encounter (HOSPITAL_COMMUNITY): Payer: Self-pay | Admitting: Obstetrics and Gynecology

## 2011-02-26 ENCOUNTER — Encounter (HOSPITAL_COMMUNITY): Payer: Self-pay | Admitting: *Deleted

## 2012-05-28 ENCOUNTER — Encounter: Payer: Self-pay | Admitting: Anesthesiology

## 2012-06-03 ENCOUNTER — Other Ambulatory Visit (HOSPITAL_COMMUNITY)
Admission: RE | Admit: 2012-06-03 | Discharge: 2012-06-03 | Disposition: A | Discharge status: Home / Self Care | Payer: BC Managed Care – PPO | Source: Ambulatory Visit | Attending: Gynecology | Admitting: Gynecology

## 2012-06-03 ENCOUNTER — Encounter: Payer: Self-pay | Admitting: Gynecology

## 2012-06-03 ENCOUNTER — Ambulatory Visit (INDEPENDENT_AMBULATORY_CARE_PROVIDER_SITE_OTHER): Payer: BC Managed Care – PPO | Admitting: Gynecology

## 2012-06-03 VITALS — BP 128/86 | Ht 63.75 in | Wt 218.0 lb

## 2012-06-03 DIAGNOSIS — Z01419 Encounter for gynecological examination (general) (routine) without abnormal findings: Secondary | ICD-10-CM | POA: Insufficient documentation

## 2012-06-03 DIAGNOSIS — Z1151 Encounter for screening for human papillomavirus (HPV): Secondary | ICD-10-CM | POA: Insufficient documentation

## 2012-06-03 DIAGNOSIS — D069 Carcinoma in situ of cervix, unspecified: Secondary | ICD-10-CM | POA: Insufficient documentation

## 2012-06-03 DIAGNOSIS — G43829 Menstrual migraine, not intractable, without status migrainosus: Secondary | ICD-10-CM

## 2012-06-03 DIAGNOSIS — R635 Abnormal weight gain: Secondary | ICD-10-CM

## 2012-06-03 LAB — CBC WITH DIFFERENTIAL/PLATELET
HCT: 34.2 % — ABNORMAL LOW (ref 36.0–46.0)
Hemoglobin: 11.2 g/dL — ABNORMAL LOW (ref 12.0–15.0)
Lymphocytes Relative: 38 % (ref 12–46)
Lymphs Abs: 3.5 10*3/uL (ref 0.7–4.0)
MCHC: 32.7 g/dL (ref 30.0–36.0)
Monocytes Absolute: 0.8 10*3/uL (ref 0.1–1.0)
Monocytes Relative: 9 % (ref 3–12)
Neutro Abs: 4.6 10*3/uL (ref 1.7–7.7)
Neutrophils Relative %: 51 % (ref 43–77)
RBC: 4.79 MIL/uL (ref 3.87–5.11)

## 2012-06-03 MED ORDER — ESTRADIOL 0.0375 MG/24HR TD PTTW: MEDICATED_PATCH | TRANSDERMAL | Status: DC

## 2012-06-03 NOTE — Progress Notes (Signed)
[name] February 15, 1976 [id number]   History:    37 y.o.  for annual gyn exam who last year had a cesarean section with tubal sterilization procedure. 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 Her followup Pap smears have been normal. Her last Pap smear was in 2011. Patient states her cycles are regular. She states that she suffers from headaches right before the start of her menses. She also been complaining at times and dyspareunia. Review of her record indicated she had a benign left breast biopsy in 1996. She did receive the TM vaccine during her last pregnancy.  Past medical history,surgical history, family history and social history were all reviewed and documented in the EPIC chart.  Gynecologic History Patient's last menstrual period was 05/25/2012. Contraception: tubal ligation Last Pap: 2011. Results were: normal Last mammogram: 1996. Results were: Benign left breast biopsy   Obstetric History OB History    Grav Para Term Preterm Abortions TAB SAB Ect Mult Living   5 5 1 4  0 0 0 0 0 5     # Outc Date GA Lbr Len/2nd Wgt Sex Del Anes PTL Lv   1 PRE 1/95     SVD      Comments: 36wks svd.    2 PRE 11/96     SVD      Comments: preE. 36wks svd   3 PRE 3/99     SVD      Comments: 36wks svd. placental abruption   4 PRE 2/09     SVD      Comments: 36 wks svd.    5 TRM 9/12 [redacted]w[redacted]d 00:00 9lb3.1oz(4.17kg) M LTCS Spinal  Yes       ROS: A ROS was performed and pertinent positives and negatives are included in the history.  GENERAL: No fevers or chills. HEENT: No change in vision, no earache, sore throat or sinus congestion. NECK: No pain or stiffness. CARDIOVASCULAR: No chest pain or pressure. No palpitations. PULMONARY: No shortness of breath, cough or wheeze. GASTROINTESTINAL: No abdominal pain, nausea, vomiting or diarrhea, melena or bright red blood per rectum. GENITOURINARY: No urinary frequency,  urgency, hesitancy or dysuria. MUSCULOSKELETAL: No joint or muscle pain, no back pain, no recent trauma. DERMATOLOGIC: No rash, no itching, no lesions. ENDOCRINE: No polyuria, polydipsia, no heat or cold intolerance. No recent change in weight. HEMATOLOGICAL: No anemia or easy bruising or bleeding. NEUROLOGIC: No headache, seizures, numbness, tingling or weakness. PSYCHIATRIC: No depression, no loss of interest in normal activity or change in sleep pattern.     Exam: chaperone present  BP 128/86  Ht 5' 3.75" (1.619 m)  Wt 218 lb (98.884 kg)  BMI 37.71 kg/m2  LMP 05/25/2012  Body mass index is 37.71 kg/(m^2).  General appearance : Well developed well nourished female. No acute distress HEENT: Neck supple, trachea midline, no carotid bruits, no thyroidmegaly Lungs: Clear to auscultation, no rhonchi or wheezes, or rib retractions  Heart: Regular rate and rhythm, no murmurs or gallops Breast:Examined in sitting and supine position were symmetrical in appearance, no palpable masses or tenderness,  no skin retraction, no nipple inversion, no nipple discharge, no skin discoloration, no axillary or supraclavicular lymphadenopathy Abdomen: no palpable masses or tenderness, no rebound or guarding Extremities: no edema or skin discoloration or tenderness  Pelvic:  Bartholin, Urethra, Skene Glands: Within normal limits  Vagina: No gross lesions or discharge  Cervix: No gross lesions or discharge  Uterus  anteverted, normal size, shape and consistency, non-tender and mobile  Adnexa  Without masses or tenderness  Anus and perineum  normal   Rectovaginal  normal sphincter tone without palpated masses or tenderness             Hemoccult not indicated     Assessment/Plan:  37 y.o. 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 Patient began to exercise on a more regular basis and is on a diet to lose  weight. We discussed importance of monthly self breast examination. We also discussed using lubricants during intercourse. Her menstrual migraine she is going to be placed on Minivelle 0.0375 to apply transdermally 3-4 days before her menses. The risks benefits and pros and cons were discussed. The following labs were today: CBC, screening cholesterol, hemoglobin A1c, TSH and urinalysis. We'll see her back in one year or when necessary.    Ok Edwards MD, 4:26 PM 06/03/2012

## 2012-06-03 NOTE — Patient Instructions (Addendum)
Menstrual migraine and menstrually related migraine refer to migraine headaches that occur in close temporal relationship to menses, typically two days before onset through the third day of menstrual bleeding.A decline in estrogen concentration is an important factor in triggering migraine in women. Apply the patch  2-3 days before the start of your menses.

## 2012-06-04 LAB — HEMOGLOBIN A1C
Hgb A1c MFr Bld: 5.4 % (ref <5.7)
Mean Plasma Glucose: 108 mg/dL (ref <117)

## 2012-06-15 ENCOUNTER — Telehealth: Payer: Self-pay | Admitting: *Deleted

## 2012-06-15 NOTE — Telephone Encounter (Signed)
Pt informed with normal pap results from [facility] 06/03/12.

## 2013-02-12 IMAGING — US US OB COMP +14 WK
1 series · 12 of 28 positions shown · non-contrast
Comparison: none

[Series 1: us ob comp +14 wk · 64 acquisitions, 12 frames shown]
[im 3/64]
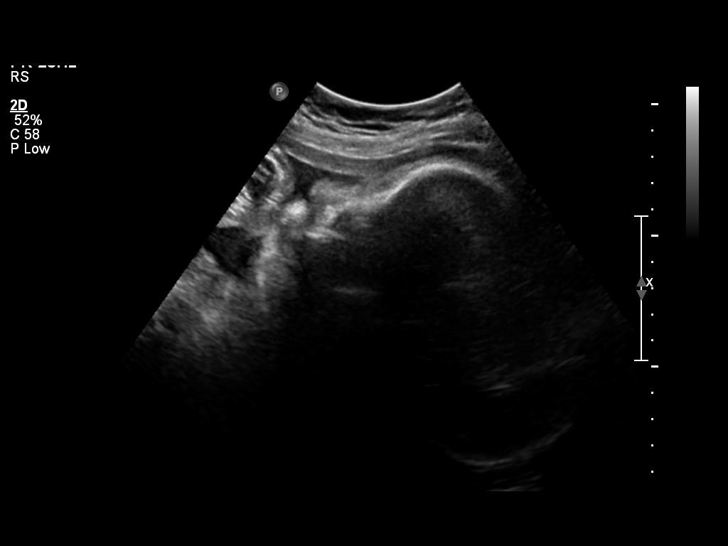
[im 8/64]
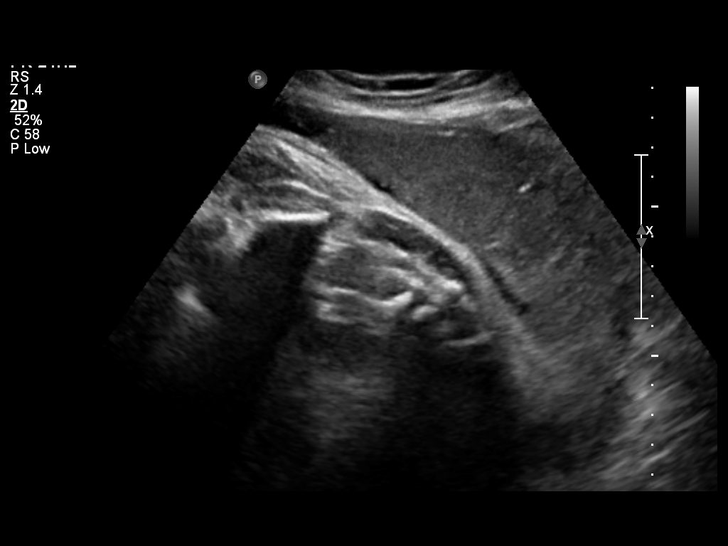
[im 12/64]
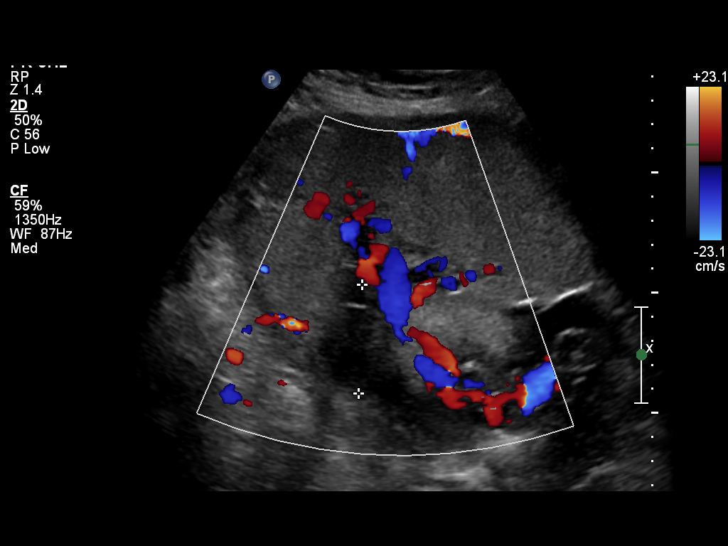
[im 19/64]
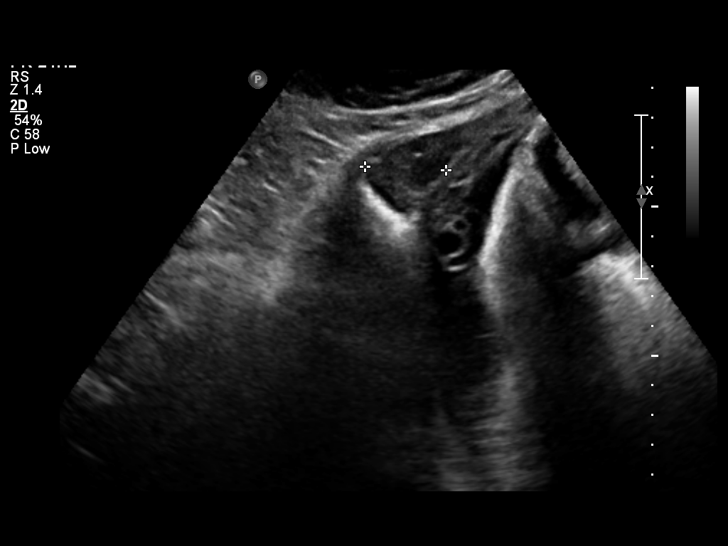
[im 24/64]
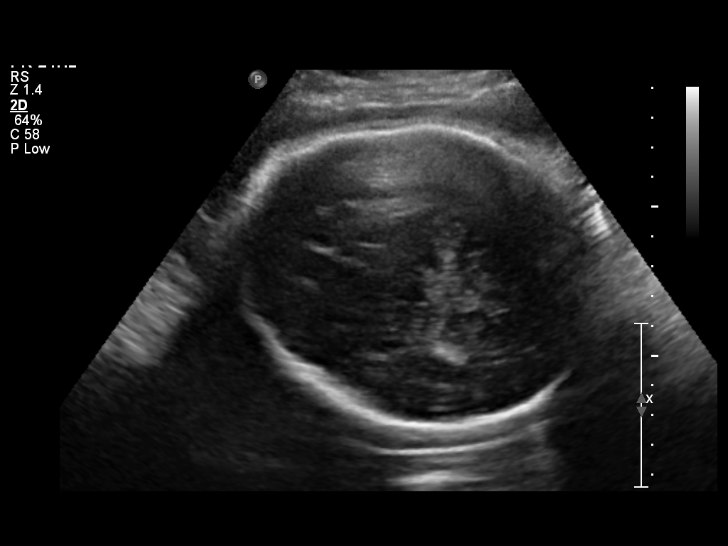
[im 29/64]
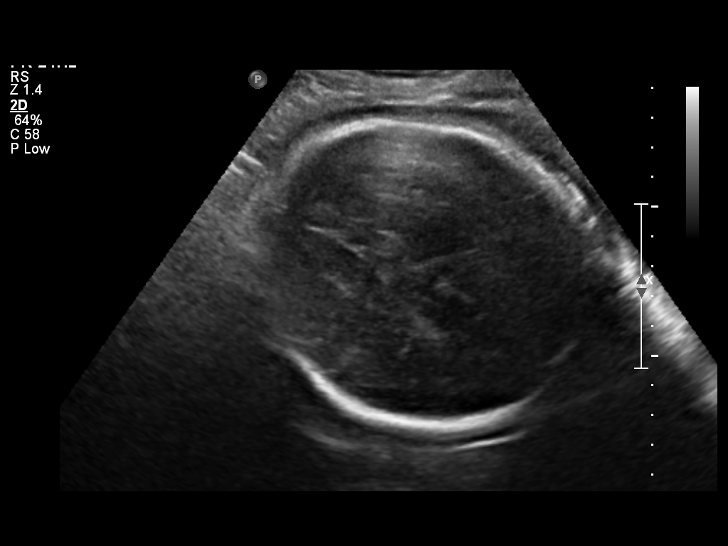
[im 36/64]
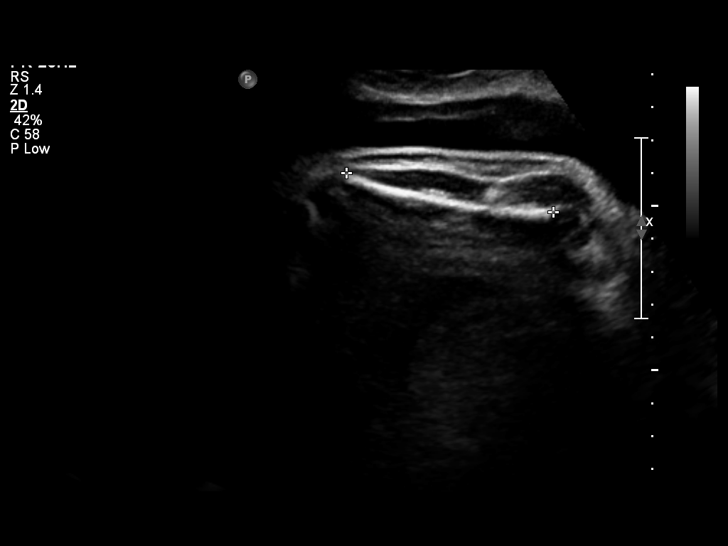
[im 40/64]
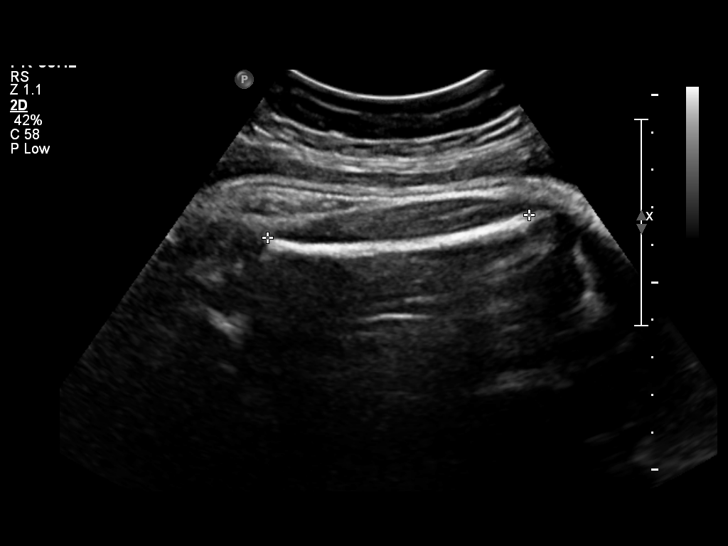
[im 45/64]
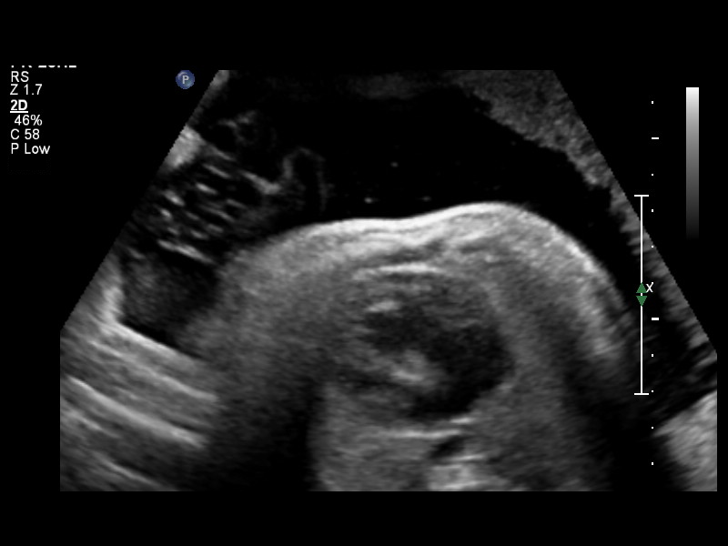
[im 52/64]
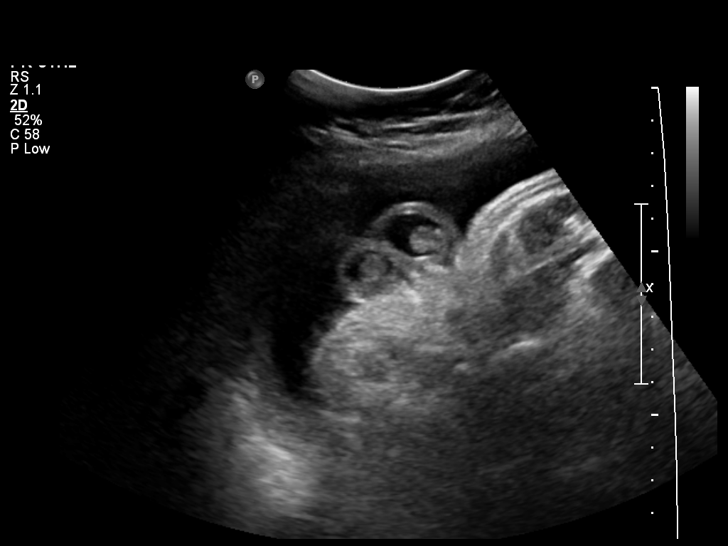
[im 57/64]
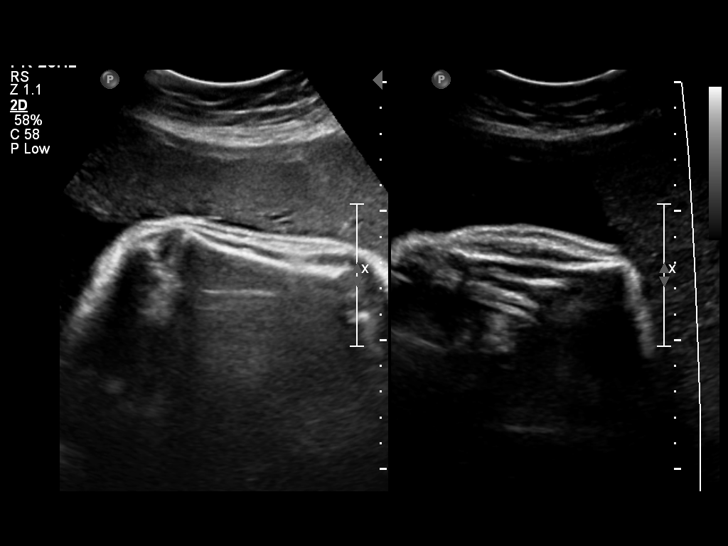
[im 61/64]
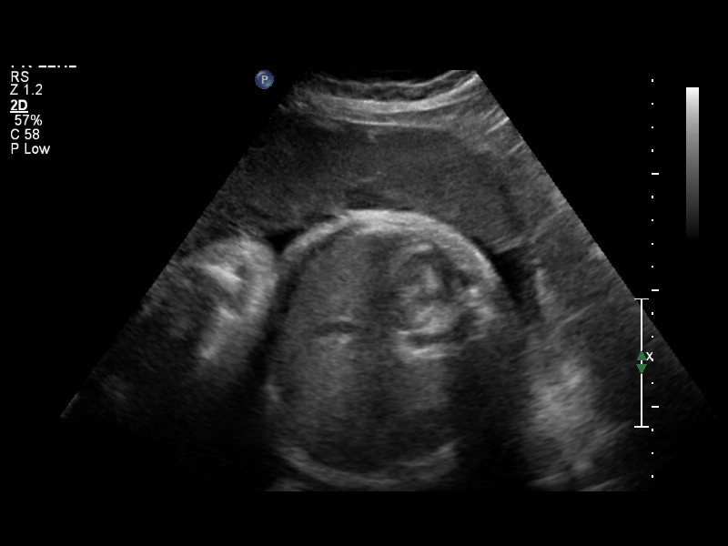

[12 of 28 positions shown; findings below may reference images not displayed]

OBSTETRICS REPORT
                      (Signed Final 01/24/2011 [DATE])

 Order#:         [id number]
Procedures

 US OB COMP + 14 WK                                    76805.1
Indications

 Basic anatomic survey
 Advanced maternal age (AMA), Multigravida
 Poor obstetric history: Previous preterm delivery @
 36 weeks x 4
 Poor obstetric history: Previous gestational HTN
 Size greater than dates (Large for gestational [AGE]
 Assess Fetal Growth / Estimated Fetal Weight
 Polyhydramnios/ Increased AFI
 Previous cervical surgery (Agnieszka, Futrzak) 0773
Fetal Evaluation

 Fetal Heart Rate:  172                          bpm
 Cardiac Activity:  Observed
 Presentation:      Cephalic

 Amniotic Fluid
 AFI FV:      Subjectively increased
 AFI Sum:     22.46   cm       91  %Tile     Larg Pckt:     8.4  cm
 RUQ:   8.4     cm   RLQ:    4.51   cm    LUQ:   4.98    cm   LLQ:    4.57   cm
Biometry

 BPD:     98.8  mm     G. Age:  40w 4d                CI:        75.89   70 - 86
                                                      FL/HC:      19.5   20.9 -

 HC:     359.5  mm     G. Age:  N/A        > 97  %    HC/AC:      0.95   0.92 -

 AC:       378  mm     G. Age:  41w 5d     > 97  %    FL/BPD:     71.1   71 - 87
 FL:      70.2  mm     G. Age:  36w 0d        7  %    FL/AC:      18.6   20 - 24
 HUM:     63.7  mm     G. Age:  37w 0d       48  %

 Est. FW:    3933  gm      9 lb 2 oz   > 90  %
Gestational Age

 Clinical EDD:  38w 3d                                        EDD:   02/04/11
 U/S Today:     39w 3d                                        EDD:   01/28/11
 Best:          38w 3d     Det. By:  Clinical EDD             EDD:   02/04/11
Anatomy

 Cranium:           Appears normal      Aortic Arch:       Basic anatomy
                                                           exam per order
 Fetal Cavum:       Not well            Ductal Arch:       Basic anatomy
                    visualized                             exam per order
 Ventricles:        Appears normal      Diaphragm:         Appears normal
 Choroid Plexus:    Appears normal      Stomach:           Appears
                                                           normal, left
                                                           sided
 Cerebellum:        Appears normal      Abdomen:           Appears normal
 Posterior Fossa:   Appears normal      Abdominal Wall:    Not well
                                                           visualized
 Nuchal Fold:       Not applicable      Cord Vessels:      Appears normal
                    (>20 wks GA)                           (3 vessel cord)
 Face:              Lips appear         Kidneys:           Appear normal
                    normal (basic
                    anatomy exam)
 Heart:             Not well            Bladder:           Appears normal
                    visualized
 RVOT:              Basic anatomy       Spine:             Not well
                    exam per order                         visualized
 LVOT:              Basic anatomy       Limbs:             Four extremities
                    exam per order                         visualized
                                                           (basic anatomy
                                                           exam)

 Other:     Fetus appears to be a male. Technically difficult due to
            maternal habitus and advanced GA.
Cervix Uterus Adnexa

 Cervix:       Not visualized (advanced GA >34 wks)

 Left Ovary:    Within normal limits.
 Right Ovary:   Within normal limits.
 Adnexa:     No abnormality visualized.
Comments

 Patient has appointment and NST today in the office.

 EFW range (+ / - 15%) =  6088 - 9233 grams.
Impression

 Single intrauterine gestation demonstrating an estimated
 gestational age by ultrasound of 39w 3d. This is correlated
 with expected estimated gestational age by clinical EDD of
 38w 3d. The AC is > 97% resulting in an EFW > 90% and
 compatible with a LGA fetus.  With an EFW range exceeding
 5377 grams, the possibility of underlying fetal macrosomia
 would need to be raised.

 Visualized fetal anatomy appears normal. No late developing
 fetal anatomic abnormalities are noted associated with the
 lateral ventricles, four chamber heart, stomach, kidneys or
 bladder.

 Subjectively and quantitatively normal amniotic fluid volume.
 questions or concerns.

## 2013-06-04 ENCOUNTER — Encounter: Payer: Self-pay | Admitting: Gynecology

## 2013-06-04 ENCOUNTER — Other Ambulatory Visit: Payer: Self-pay | Admitting: Gynecology

## 2013-06-04 ENCOUNTER — Other Ambulatory Visit (HOSPITAL_COMMUNITY)
Admission: RE | Admit: 2013-06-04 | Discharge: 2013-06-04 | Disposition: A | Discharge status: Home / Self Care | Payer: BC Managed Care – PPO | Source: Ambulatory Visit | Attending: Gynecology | Admitting: Gynecology

## 2013-06-04 ENCOUNTER — Ambulatory Visit (INDEPENDENT_AMBULATORY_CARE_PROVIDER_SITE_OTHER): Payer: Self-pay | Admitting: Gynecology

## 2013-06-04 VITALS — BP 126/78 | Ht 63.75 in | Wt 222.6 lb

## 2013-06-04 DIAGNOSIS — Z01419 Encounter for gynecological examination (general) (routine) without abnormal findings: Secondary | ICD-10-CM | POA: Insufficient documentation

## 2013-06-04 DIAGNOSIS — N912 Amenorrhea, unspecified: Secondary | ICD-10-CM

## 2013-06-04 DIAGNOSIS — L680 Hirsutism: Secondary | ICD-10-CM

## 2013-06-04 DIAGNOSIS — N898 Other specified noninflammatory disorders of vagina: Secondary | ICD-10-CM

## 2013-06-04 DIAGNOSIS — R635 Abnormal weight gain: Secondary | ICD-10-CM

## 2013-06-04 DIAGNOSIS — R51 Headache: Secondary | ICD-10-CM

## 2013-06-04 DIAGNOSIS — R42 Dizziness and giddiness: Secondary | ICD-10-CM

## 2013-06-04 DIAGNOSIS — R21 Rash and other nonspecific skin eruption: Secondary | ICD-10-CM

## 2013-06-04 DIAGNOSIS — Z87410 Personal history of cervical dysplasia: Secondary | ICD-10-CM

## 2013-06-04 LAB — CBC WITH DIFFERENTIAL/PLATELET
BASOS ABS: 0 10*3/uL (ref 0.0–0.1)
Basophils Relative: 0 % (ref 0–1)
EOS ABS: 0.2 10*3/uL (ref 0.0–0.7)
Eosinophils Relative: 1 % (ref 0–5)
HCT: 39.1 % (ref 36.0–46.0)
Hemoglobin: 12.8 g/dL (ref 12.0–15.0)
LYMPHS ABS: 3.7 10*3/uL (ref 0.7–4.0)
Lymphocytes Relative: 29 % (ref 12–46)
MCH: 25.2 pg — AB (ref 26.0–34.0)
MCHC: 32.7 g/dL (ref 30.0–36.0)
MCV: 77.1 fL — ABNORMAL LOW (ref 78.0–100.0)
Monocytes Absolute: 1 10*3/uL (ref 0.1–1.0)
Monocytes Relative: 8 % (ref 3–12)
NEUTROS PCT: 62 % (ref 43–77)
Neutro Abs: 8 10*3/uL — ABNORMAL HIGH (ref 1.7–7.7)
PLATELETS: 332 10*3/uL (ref 150–400)
RBC: 5.07 MIL/uL (ref 3.87–5.11)
RDW: 15.7 % — ABNORMAL HIGH (ref 11.5–15.5)
WBC: 12.9 10*3/uL — AB (ref 4.0–10.5)

## 2013-06-04 LAB — WET PREP FOR TRICH, YEAST, CLUE
Clue Cells Wet Prep HPF POC: NONE SEEN
TRICH WET PREP: NONE SEEN
WBC, Wet Prep HPF POC: NONE SEEN
Yeast Wet Prep HPF POC: NONE SEEN

## 2013-06-04 LAB — CHOLESTEROL, TOTAL: CHOLESTEROL: 178 mg/dL (ref 0–200)

## 2013-06-04 LAB — PREGNANCY, URINE: Preg Test, Ur: NEGATIVE

## 2013-06-04 MED ORDER — MEDROXYPROGESTERONE ACETATE 10 MG PO TABS: 10.0000 mg | ORAL_TABLET | Freq: Every day | ORAL | Status: DC

## 2013-06-04 MED ORDER — PREDNISONE (PAK) 10 MG PO TABS: ORAL_TABLET | Freq: Every day | ORAL | Status: DC

## 2013-06-04 NOTE — Patient Instructions (Addendum)
Tetanus, Diphtheria (Td) Vaccine What You Need to Know WHY GET VACCINATED? Tetanus  and diphtheria are very serious diseases. They are rare in the [place] today, but people who do become infected often have severe complications. Td vaccine is used to protect adolescents and adults from both of these diseases. Both tetanus and diphtheria are infections caused by bacteria. Diphtheria spreads from person to person through coughing or sneezing. Tetanus-causing bacteria enter the body through cuts, scratches, or wounds. TETANUS (Lockjaw) causes painful muscle tightening and stiffness, usually all over the body.  It can lead to tightening of muscles in the head and neck so you can't open your mouth, swallow, or sometimes even breathe. Tetanus kills about 1 out of every 5 people who are infected. DIPHTHERIA can cause a thick coating to form in the back of the throat.  It can lead to breathing problems, paralysis, heart failure, and death. Before vaccines, the [place] saw as many as 200,000 cases a year of diphtheria and hundreds of cases of tetanus. Since vaccination began, cases of both diseases have dropped by about 99%. TD VACCINE Td vaccine can protect adolescents and adults from tetanus and diphtheria. Td is usually given as a booster dose every 10 years but it can also be given earlier after a severe and dirty wound or burn. Your doctor can give you more information. Td may safely be given at the same time as other vaccines. SOME PEOPLE SHOULD NOT GET THIS VACCINE  If you ever had a life-threatening allergic reaction after a dose of any tetanus or diphtheria containing vaccine, OR if you have a severe allergy to any part of this vaccine, you should not get Td. Tell your doctor if you have any severe allergies.  Talk to your doctor if you:  have epilepsy or another nervous system problem,  had severe pain or swelling after any vaccine containing diphtheria or tetanus,  ever had  Guillain Barr Syndrome (GBS),  aren't feeling well on the day the shot is scheduled. RISKS OF A VACCINE REACTION With a vaccine, like any medicine, there is a chance of side effects. These are usually mild and go away on their own. Serious side effects are also possible, but are very rare. Most people who get Td vaccine do not have any problems with it. Mild Problems  following Td (Did not interfere with activities)  Pain where the shot was given (about 8 people in 10)  Redness or swelling where the shot was given (about 1 person in 3)  Mild fever (about 1 person in 15)  Headache or Tiredness (uncommon) Moderate Problems following Td (Interfered with activities, but did not require medical attention)  Fever over 102 F (38.9 C) (rare) Severe Problems  following Td (Unable to perform usual activities; required medical attention)  Swelling, severe pain, bleeding, or redness in the arm where the shot was given (rare). Problems that could happen after any vaccine:  Brief fainting spells can happen after any medical procedure, including vaccination. Sitting or lying down for about 15 minutes can help prevent fainting, and injuries caused by a fall. Tell your doctor if you feel dizzy, or have vision changes or ringing in the ears.  Severe shoulder pain and reduced range of motion in the arm where a shot was given can happen, very rarely, after a vaccination.  Severe allergic reactions from a vaccine are very rare, estimated at less than 1 in a million doses. If one were to occur, it would   usually be within a few minutes to a few hours after the vaccination. WHAT IF THERE IS A SERIOUS REACTION? What should I look for?  Look for anything that concerns you, such as signs of a severe allergic reaction, very high fever, or behavior changes. Signs of a severe allergic reaction can include hives, swelling of the face and throat, difficulty breathing, a fast heartbeat, dizziness, and  weakness. These would usually start a few minutes to a few hours after the vaccination. What should I do?  If you think it is a severe allergic reaction or other emergency that can't wait, call 911 or get the person to the nearest hospital. Otherwise, call your doctor.  Afterward, the reaction should be reported to the Vaccine Adverse Event Reporting System (VAERS). Your doctor might file this report, or, you can do it yourself through the VAERS website or by calling 1-859-344-4217. VAERS is only for reporting reactions. They do not give medical advice. THE NATIONAL VACCINE INJURY COMPENSATION PROGRAM The National Vaccine Injury Compensation Program (VICP) is a federal program that was created to compensate people who may have been injured by certain vaccines. Persons who believe they may have been injured by a vaccine can learn about the program and about filing a claim by calling 1-941-867-3498 or visiting the [facility] website. HOW CAN I LEARN MORE?  Ask your doctor.  Contact your local or state health department.  Contact the Centers for Disease Control and Prevention (CDC):  Call (401)065-81981-718 198 0708 (1-800-CDC-INFO)  Visit CDC's vaccines website CDC Td Vaccine Interim VIS (06/16/12) Document Released: 02/24/2006 Document Revised: 08/24/2012 Document Reviewed: 08/19/2012 [facility] Patient Information 2014 [place], [place].   Polycystic Ovarian Syndrome Polycystic ovarian syndrome (PCOS) is a common hormonal disorder among women of reproductive age. Most women with PCOS grow many small cysts on their ovaries. PCOS can cause problems with your periods and make it difficult to get pregnant. It can also cause an increased risk of miscarriage with pregnancy. If left untreated, PCOS can lead to serious health problems, such as diabetes and heart disease. CAUSES The cause of PCOS is not fully understood, but genetics may be a factor. SIGNS AND SYMPTOMS   Infrequent or no menstrual periods.    Inability to get pregnant (infertility) because of not ovulating.   Increased growth of hair on the face, chest, stomach, back, thumbs, thighs, or toes.   Acne, oily skin, or dandruff.   Pelvic pain.   Weight gain or obesity, usually carrying extra weight around the waist.   Type 2 diabetes.   High cholesterol.   High blood pressure.   Female-pattern baldness or thinning hair.   Patches of thickened and dark brown or black skin on the neck, arms, breasts, or thighs.   Tiny excess flaps of skin (skin tags) in the armpits or neck area.   Excessive snoring and having breathing stop at times while asleep (sleep apnea).   Deepening of the voice.   Gestational diabetes when pregnant.  DIAGNOSIS  There is no single test to diagnose PCOS.   Your health care provider will:   Take a medical history.   Perform a pelvic exam.   Have ultrasonography done.   Check your female and female hormone levels.   Measure glucose or sugar levels in the blood.   Do other blood tests.   If you are producing too many female hormones, your health care provider will make sure it is from PCOS. At the physical exam, your health care provider will  want to evaluate the areas of increased hair growth. Try to allow natural hair growth for a few days before the visit.   During a pelvic exam, the ovaries may be enlarged or swollen because of the increased number of small cysts. This can be seen more easily by using vaginal ultrasonography or screening to examine the ovaries and lining of the uterus (endometrium) for cysts. The uterine lining may become thicker if you have not been having a regular period.  TREATMENT  Because there is no cure for PCOS, it needs to be managed to prevent problems. Treatments are based on your symptoms. Treatment is also based on whether you want to have a baby or whether you need contraception.  Treatment may include:   Progesterone hormone to  start a menstrual period.   Birth control pills to make you have regular menstrual periods.   Medicines to make you ovulate, if you want to get pregnant.   Medicines to control your insulin.   Medicine to control your blood pressure.   Medicine and diet to control your high cholesterol and triglycerides in your blood.  Medicine to reduce excessive hair growth.  Surgery, making small holes in the ovary, to decrease the amount of female hormone production. This is done through a long, lighted tube (laparoscope) placed into the pelvis through a tiny incision in the lower abdomen.  HOME CARE INSTRUCTIONS  Only take over-the-counter or prescription medicine as directed by your health care provider.  Pay attention to the foods you eat and your activity levels. This can help reduce the effects of PCOS.  Keep your weight under control.  Eat foods that are low in carbohydrate and high in fiber.  Exercise regularly. SEEK MEDICAL CARE IF:  Your symptoms do not get better with medicine.  You have new symptoms. Document Released: 08/23/2004 Document Revised: 02/17/2013 Document Reviewed: 10/15/2012 [facility] Patient Information 2014 [place], [place].

## 2013-06-04 NOTE — Progress Notes (Signed)
[name] [birth year] [id number]   History:    38 y.o.  for annual gyn exam with several complaints today. The patient has noted a rash on her hands for approximately 8 months and has tried Benadryl, or calamine lotion and topical corticosteroids that she bought over-the-counter with no relief. She has seek no medical attention. She also has not had a menstrual cycle in 4 months and has noted hair changes as well as weight gain. She has noted some hair growth on her chin. She also has had some headaches and some visual disturbances at times. She denies any galactorrhea. 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 Her followup Pap smears have been normal. Patient has also had a benign left breast biopsy in 1996. She did receive her Tdap vaccine during her pregnancy. Patient also thought she had a vaginal discharge today as well.  The urine pregnancy test was done in the office today which was negative.   Past medical history,surgical history, family history and social history were all reviewed and documented in the EPIC chart.  Gynecologic History Patient's last menstrual period was 02/02/2013. Contraception: tubal ligation Last Pap: 2014. Results were: normal Last mammogram: Not indicated. Results were: Not indicated  Obstetric History OB History  Gravida Para Term Preterm AB SAB TAB Ectopic Multiple Living  5 5 1 4  0 0 0 0 0 5    # Outcome Date GA Lbr Len/2nd Weight Sex Delivery Anes PTL Lv  5 TRM 01/30/11 [id number]  9 lb 3.1 oz (4.17 kg) M LTCS Spinal  Y  4 PRE 07/08/07     SVD        Comments: 36 wks svd.   3 PRE 07/30/97     SVD        Comments: 36wks svd. placental abruption  2 PRE 03/22/95     SVD        Comments: preE. 36wks svd  1 PRE 06/05/93     SVD        Comments: 36wks svd.        ROS: A ROS was performed and  pertinent positives and negatives are included in the history.  GENERAL: No fevers or chills. HEENT: No change in vision, no earache, sore throat or sinus congestion. NECK: No pain or stiffness. CARDIOVASCULAR: No chest pain or pressure. No palpitations. PULMONARY: No shortness of breath, cough or wheeze. GASTROINTESTINAL: No abdominal pain, nausea, vomiting or diarrhea, melena or bright red blood per rectum. GENITOURINARY: No urinary frequency, urgency, hesitancy or dysuria. MUSCULOSKELETAL: No joint or muscle pain, no back pain, no recent trauma. DERMATOLOGIC: Rash on both palms ENDOCRINE: No polyuria, polydipsia, no heat or cold intolerance. No recent change in weight. HEMATOLOGICAL: No anemia or easy bruising or bleeding. NEUROLOGIC: No headache, seizures, numbness, tingling or weakness. PSYCHIATRIC: No depression, no loss of interest in normal activity or change in sleep pattern.     Exam: chaperone present  BP 126/78  Ht 5' 3.75" (1.619 m)  Wt 222 lb 9.6 oz (100.971 kg)  BMI 38.52 kg/m2  LMP 02/02/2013  Body mass index is 38.52 kg/(m^2).  General appearance : Well developed well nourished female. No acute distress HEENT: Neck supple, trachea midline, no carotid bruits, no thyroidmegaly Lungs: Clear to auscultation, no rhonchi or wheezes, or rib retractions  Heart: Regular rate and rhythm, no  murmurs or gallops Breast:Examined in sitting and supine position were symmetrical in appearance, no palpable masses or tenderness,  no skin retraction, no nipple inversion, no nipple discharge, no skin discoloration, no axillary or supraclavicular lymphadenopathy Abdomen: no palpable masses or tenderness, no rebound or guarding Extremities: no edema or skin discoloration or tenderness Rash on both palms Pelvic:  Bartholin, Urethra, Skene Glands: Within normal limits             Vagina: No gross lesions or discharge  Cervix: No gross lesions or discharge  Uterus  anteverted, normal size, shape  and consistency, non-tender and mobile  Adnexa  Without masses or tenderness  Anus and perineum  normal   Rectovaginal  normal sphincter tone without palpated masses or tenderness             Hemoccult not indicated   Wet prep negative  Assessment/Plan:  38 y.o. female for annual exam with eight-month history of rash on both palms.six-day steroid treatment. She will continue to use calamine or Benadryl lotion and will be referred to a dermatologist for further evaluation. As to her secondary amenorrhea we are going to start her on Provera 10 mg one by mouth daily for 10 days if her TSH and prolactin are normal today. We are also going to get a total testosterone, DHEAS, as well as 17 hydroxyprogesterone and CBC and screen cholesterol and urinalysis. Pap smear was done today. She will make an appointment to return to see me in 2 weeks to plan and course of management. She was reminded to do her monthly breast exams.   : This dictation was prepared with  Dragon/digital dictation along withSmart phrase technology. Any transcriptional errors that result from this process are unintentional.   Ok Edwards MD, 6:10 PM 06/04/2013

## 2013-06-05 LAB — URINALYSIS W MICROSCOPIC + REFLEX CULTURE
BACTERIA UA: NONE SEEN
BILIRUBIN URINE: NEGATIVE
CASTS: NONE SEEN
GLUCOSE, UA: NEGATIVE mg/dL
Hgb urine dipstick: NEGATIVE
KETONES UR: NEGATIVE mg/dL
Leukocytes, UA: NEGATIVE
Nitrite: NEGATIVE
PH: 5 (ref 5.0–8.0)
Protein, ur: NEGATIVE mg/dL
UROBILINOGEN UA: 0.2 mg/dL (ref 0.0–1.0)

## 2013-06-05 LAB — TSH: TSH: 1.053 u[IU]/mL (ref 0.350–4.500)

## 2013-06-05 LAB — TESTOSTERONE: Testosterone: 79 ng/dL — ABNORMAL HIGH (ref 10–70)

## 2013-06-07 LAB — PROLACTIN: Prolactin: 9.5 ng/mL

## 2013-06-09 ENCOUNTER — Telehealth: Payer: Self-pay | Admitting: *Deleted

## 2013-06-09 LAB — 17-HYDROXYPROGESTERONE: 17-OH-PROGESTERONE, LC/MS/MS: 106 ng/dL

## 2013-06-09 NOTE — Telephone Encounter (Signed)
appt on 06/29/13 @ 2:20 pm [facility]

## 2013-06-09 NOTE — Telephone Encounter (Signed)
Message copied by Aura CampsWEBB, Cheo Selvey L on Wed Jun 09, 2013  4:08 PM ------      Message from: Keenan BachelorANNAS, KATHERINE R      Created: Wed Jun 09, 2013  3:54 PM      Regarding: derm referral       Please make sure that an appointment has been made for this patient with a dermatologist with a severe bilateral palmar rash. Please send the labs to the dermatologist for their review as well. ------

## 2013-06-10 LAB — DHEA: DHEA: 210 ng/dL (ref 102–1185)

## 2013-06-17 ENCOUNTER — Telehealth: Payer: Self-pay | Admitting: *Deleted

## 2013-06-17 NOTE — Telephone Encounter (Signed)
Pt was given Provera 10 mg one by mouth daily for 10 days on OV 06/04/13 due to no period in 4 months. Pt said her cycle started on Wednesday, she is scheduled for follow up appointment regarding this on 06/21/13. Pt asked if she should keep scheduled appointment? Please advise

## 2013-06-17 NOTE — Telephone Encounter (Signed)
Pt informed with the below note. 

## 2013-06-17 NOTE — Telephone Encounter (Signed)
Yes, we need to plan long term treatment. Please make sure she made appointment with dermatologist. We need to go over the rest of her test.

## 2013-06-21 ENCOUNTER — Ambulatory Visit: Payer: Self-pay | Admitting: Gynecology

## 2013-06-30 ENCOUNTER — Ambulatory Visit (INDEPENDENT_AMBULATORY_CARE_PROVIDER_SITE_OTHER): Payer: Self-pay | Admitting: Gynecology

## 2013-06-30 ENCOUNTER — Encounter: Payer: Self-pay | Admitting: Gynecology

## 2013-06-30 VITALS — BP 128/80

## 2013-06-30 DIAGNOSIS — E349 Endocrine disorder, unspecified: Secondary | ICD-10-CM

## 2013-06-30 DIAGNOSIS — E663 Overweight: Secondary | ICD-10-CM

## 2013-06-30 DIAGNOSIS — N970 Female infertility associated with anovulation: Secondary | ICD-10-CM

## 2013-06-30 MED ORDER — LEVONORGESTREL-ETHINYL ESTRAD 0.1-20 MG-MCG PO TABS: 1.0000 | ORAL_TABLET | Freq: Every day | ORAL | Status: DC

## 2013-06-30 NOTE — Patient Instructions (Signed)
Oral Contraception Use  Oral contraceptive pills (OCPs) are medicines taken to prevent pregnancy. OCPs work by preventing the ovaries from releasing eggs. The hormones in OCPs also cause the cervical mucus to thicken, preventing the sperm from entering the uterus. The hormones also cause the uterine lining to become thin, not allowing a fertilized egg to attach to the inside of the uterus. OCPs are highly effective when taken exactly as prescribed. However, OCPs do not prevent sexually transmitted diseases (STDs). Safe sex practices, such as using condoms along with an OCP, can help prevent STDs.  Before taking OCPs, you may have a physical exam and Pap test. Your health care provider may also order blood tests if necessary. Your health care provider will make sure you are a good candidate for oral contraception. Discuss with your health care provider the possible side effects of the OCP you may be prescribed. When starting an OCP, it can take 2 to 3 months for the body to adjust to the changes in hormone levels in your body.   HOW TO TAKE ORAL CONTRACEPTIVE PILLS  Your health care provider may advise you on how to start taking the first cycle of OCPs. Otherwise, you can:   · Start on day 1 of your menstrual period. You will not need any backup contraceptive protection with this start time.    · Start on the first Sunday after your menstrual period or the day you get your prescription. In these cases, you will need to use backup contraceptive protection for the first week.    · Start the pill at any time of your cycle. If you take the pill within 5 days of the start of your period, you are protected against pregnancy right away. In this case, you will not need a backup form of birth control. If you start at any other time of your menstrual cycle, you will need to use another form of birth control for 7 days. If your OCP is the type called a minipill, it will protect you from pregnancy after taking it for 2 days (48  hours).  After you have started taking OCPs:   · If you forget to take 1 pill, take it as soon as you remember. Take the next pill at the regular time.    · If you miss 2 or more pills, call your health care provider because different pills have different instructions for missed doses. Use backup birth control until your next menstrual period starts.    · If you use a 28-day pack that contains inactive pills and you miss 1 of the last 7 pills (pills with no hormones), it will not matter. Throw away the rest of the nonhormone pills and start a new pill pack.    No matter which day you start the OCP, you will always start a new pack on that same day of the week. Have an extra pack of OCPs and a backup contraceptive method available in case you miss some pills or lose your OCP pack.   HOME CARE INSTRUCTIONS   · Do not smoke.    · Always use a condom to protect against STDs. OCPs do not protect against STDs.    · Use a calendar to mark your menstrual period days.    · Read the information and directions that came with your OCP. Talk to your health care provider if you have questions.    SEEK MEDICAL CARE IF:   · You develop nausea and vomiting.    · You have abnormal vaginal discharge   or bleeding.    · You develop a rash.    · You miss your menstrual period.    · You are losing your hair.    · You need treatment for mood swings or depression.    · You get dizzy when taking the OCP.    · You develop acne from taking the OCP.    · You become pregnant.    SEEK IMMEDIATE MEDICAL CARE IF:   · You develop chest pain.    · You develop shortness of breath.    · You have an uncontrolled or severe headache.    · You develop numbness or slurred speech.    · You develop visual problems.    · You develop pain, redness, and swelling in the legs.    Document Released: 04/18/2011 Document Revised: 12/30/2012 Document Reviewed: 10/18/2012  ExitCare® Patient Information ©2014 [employer].

## 2013-06-30 NOTE — Progress Notes (Signed)
   Patient presented today to discuss her lab tests that were done in the office at time of retinal exam on 06/04/2013 where she was complaining of not having had a menstrual cycle for 4 months. She denied any nipple discharge or any visual disturbances. She is in the process of seeing a dermatologist for this persistent palme rash which has 90% clear since I started her on a steroid packs. Please see previous note for details. Patient has had a tubal sterilization procedure in the past.  She previously had been given Provera 10 mg for 10 days to jump start her cycle but spontaneously begin to bleed and had a five-day normal cycle on 06/09/2013. Her lab results to include TSH, prolactin, DHEAS, 17 hydroxyprogesterone were all normal but her total testosterone slightly elevated at 79 indication to her slightly elevated testosterone level may be contributing to her anovulation along with her weight gain.  We discussed different treatment options to include low-dose oral contraceptive pill to prevent endometrial hyperplasia and ovarian cysts and to improve on her oily skin and actually or to take a progestational agent such as Provera 10 mg daily for 10 days of the month and she did not menstruate spontaneously. Patient would like to proceed with a low dose oral contraceptive pill. The risks benefits and pros and cons were discussed. Patient had used oral contraceptive pills many years ago before tubal ligation and had no problems. Patient denies any family history of any thrombophilia. She will be started on Aviane 38 year old contraceptive pill of note her recent Pap smear was normal. 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

## 2013-12-04 ENCOUNTER — Emergency Department (HOSPITAL_COMMUNITY)
Admission: EM | Admit: 2013-12-04 | Discharge: 2013-12-04 | Disposition: A | Discharge status: Home / Self Care | Payer: BC Managed Care – PPO | Attending: Emergency Medicine | Admitting: Emergency Medicine

## 2013-12-04 DIAGNOSIS — Z79899 Other long term (current) drug therapy: Secondary | ICD-10-CM | POA: Insufficient documentation

## 2013-12-04 DIAGNOSIS — Z880 Allergy status to penicillin: Secondary | ICD-10-CM | POA: Insufficient documentation

## 2013-12-04 DIAGNOSIS — Z87891 Personal history of nicotine dependence: Secondary | ICD-10-CM | POA: Insufficient documentation

## 2013-12-04 DIAGNOSIS — R404 Transient alteration of awareness: Secondary | ICD-10-CM | POA: Insufficient documentation

## 2013-12-04 DIAGNOSIS — R42 Dizziness and giddiness: Secondary | ICD-10-CM | POA: Insufficient documentation

## 2013-12-04 DIAGNOSIS — H5500 Unspecified nystagmus: Secondary | ICD-10-CM | POA: Insufficient documentation

## 2013-12-04 DIAGNOSIS — Z3202 Encounter for pregnancy test, result negative: Secondary | ICD-10-CM | POA: Insufficient documentation

## 2013-12-04 LAB — BASIC METABOLIC PANEL WITH GFR
Anion gap: 16 — ABNORMAL HIGH (ref 5–15)
BUN: 9 mg/dL (ref 6–23)
CO2: 24 meq/L (ref 19–32)
Calcium: 9.3 mg/dL (ref 8.4–10.5)
Chloride: 102 meq/L (ref 96–112)
Creatinine, Ser: 0.65 mg/dL (ref 0.50–1.10)
GFR calc Af Amer: >90 mL/min
GFR calc non Af Amer: >90 mL/min
Glucose, Bld: 80 mg/dL (ref 70–99)
Potassium: 3.4 meq/L — ABNORMAL LOW (ref 3.7–5.3)
Sodium: 142 meq/L (ref 137–147)

## 2013-12-04 LAB — CBC
HCT: 38.8 % (ref 36.0–46.0)
Hemoglobin: 12.5 g/dL (ref 12.0–15.0)
MCH: 25.4 pg — ABNORMAL LOW (ref 26.0–34.0)
MCHC: 32.2 g/dL (ref 30.0–36.0)
MCV: 78.9 fL (ref 78.0–100.0)
Platelets: 309 K/uL (ref 150–400)
RBC: 4.92 MIL/uL (ref 3.87–5.11)
RDW: 13.7 % (ref 11.5–15.5)
WBC: 10.2 K/uL (ref 4.0–10.5)

## 2013-12-04 LAB — D-DIMER, QUANTITATIVE: D-Dimer, Quant: <0.27 ug{FEU}/mL (ref 0.00–0.48)

## 2013-12-04 LAB — I-STAT TROPONIN, ED: Troponin i, poc: 0.01 ng/mL (ref 0.00–0.08)

## 2013-12-04 LAB — TSH: TSH: 0.538 u[IU]/mL (ref 0.350–4.500)

## 2013-12-04 LAB — HCG, SERUM, QUALITATIVE: PREG SERUM: NEGATIVE

## 2013-12-04 MED ORDER — SODIUM CHLORIDE 0.9 % IV BOLUS (SEPSIS)
1000.0000 mL | Freq: Once | INTRAVENOUS | Status: AC
  Administered 2013-12-04: 1000 mL via INTRAVENOUS

## 2013-12-04 MED ORDER — MECLIZINE HCL 50 MG PO TABS: 50.0000 mg | ORAL_TABLET | Freq: Three times a day (TID) | ORAL | Status: DC | PRN

## 2013-12-04 MED ORDER — MECLIZINE HCL 25 MG PO TABS
25.0000 mg | ORAL_TABLET | Freq: Once | ORAL | Status: AC
  Administered 2013-12-04: 25 mg via ORAL
  Filled 2013-12-04: qty 1

## 2013-12-04 MED ORDER — DIPHENHYDRAMINE HCL 50 MG/ML IJ SOLN
25.0000 mg | Freq: Once | INTRAMUSCULAR | Status: DC
  Filled 2013-12-04: qty 1

## 2013-12-04 MED ORDER — SODIUM CHLORIDE 0.9 % IV SOLN: Freq: Once | INTRAVENOUS | Status: DC

## 2013-12-04 NOTE — Discharge Instructions (Signed)
Benign Positional Vertigo Vertigo means you feel like you or your surroundings are moving when they are not. Benign positional vertigo is the most common form of vertigo. Benign means that the cause of your condition is not serious. Benign positional vertigo is more common in older adults. CAUSES  Benign positional vertigo is the result of an upset in the labyrinth system. This is an area in the middle ear that helps control your balance. This may be caused by a viral infection, head injury, or repetitive motion. However, often no specific cause is found. SYMPTOMS  Symptoms of benign positional vertigo occur when you move your head or eyes in different directions. Some of the symptoms may include:  Loss of balance and falls.  Vomiting.  Blurred vision.  Dizziness.  Nausea.  Involuntary eye movements (nystagmus). DIAGNOSIS  Benign positional vertigo is usually diagnosed by physical exam. If the specific cause of your benign positional vertigo is unknown, your caregiver may perform imaging tests, such as magnetic resonance imaging (MRI) or computed tomography (CT). TREATMENT  Your caregiver may recommend movements or procedures to correct the benign positional vertigo. Medicines such as meclizine, benzodiazepines, and medicines for nausea may be used to treat your symptoms. In rare cases, if your symptoms are caused by certain conditions that affect the inner ear, you may need surgery. HOME CARE INSTRUCTIONS   Follow your caregiver's instructions.  Move slowly. Do not make sudden body or head movements.  Avoid driving.  Avoid operating heavy machinery.  Avoid performing any tasks that would be dangerous to you or others during a vertigo episode.  Drink enough fluids to keep your urine clear or pale yellow. SEEK IMMEDIATE MEDICAL CARE IF:   You develop problems with walking, weakness, numbness, or using your arms, hands, or legs.  You have difficulty speaking.  You develop  severe headaches.  Your nausea or vomiting continues or gets worse.  You develop visual changes.  Your family or friends notice any behavioral changes.  Your condition gets worse.  You have a fever.  You develop a stiff neck or sensitivity to light. MAKE SURE YOU:   Understand these instructions.  Will watch your condition.  Will get help right away if you are not doing well or get worse. Document Released: 02/04/2006 Document Revised: 07/22/2011 Document Reviewed: 01/17/2011 ExitCare Patient Information 2015 [employer]. This information is not intended to replace advice given to you by your health care provider. Make sure you discuss any questions you have with your health care provider.    

## 2013-12-04 NOTE — ED Notes (Signed)
While home, pt stood up felt dizzy and had syncopal episode. Woke up with child crying on top of her. Endorses chest pain at this time. Initial HR upon EMS arrival 140s. Pt is sinus tach here HR 110s,  Denies SOB at this time. Alert, oriented, MAE x4.

## 2013-12-08 NOTE — ED Provider Notes (Signed)
CSN: [id number]     Arrival date & time 12/04/13  1632 History   First MD Initiated Contact with Patient 12/04/13 1633     Chief Complaint  Patient presents with  . Loss of Consciousness      HPI  Patient presents after an episode at home. She stood up and started walking across the floor she murmurs only through spinning. Exiting shoe we'll as she is on the floor her child was  . Did not injure herself. She feels dizzy when she lifts her head or moves her head side to side now. Out nausea with head movements here. No palpitations or chest pain. No dyspnea. No recent nausea vomiting diarrhea or other forms of fluid loss. Eating and drinking normally.  Past Medical History  Diagnosis Date  . Preterm labor   . Abnormal Pap smear 2007    colpo, leep  . No pertinent past medical history    Past Surgical History  Procedure Laterality Date  . Cholecystectomy  2006  . Tubal ligation  1999    tubal reversal in 2006  . Tubal reversal    . Leep    . Cesarean section  01/30/2011    Procedure: CESAREAN SECTION;  Surgeon: Oliver PilaKathy W Richardson;  Location: [facility];  Service: Gynecology;  Laterality: N/A;  Repeat with Bilateral Tubal Ligation   . Breast biopsy  1996    right  breast  . Cervical biopsy  w/ loop electrode excision    . Cholecystectomy, laparoscopic     Family History  Problem Relation Age of Onset  . Heart disease Mother   . Heart disease Father   . Breast cancer Maternal Grandmother   . Breast cancer Paternal Grandmother    History  Substance Use Topics  . Smoking status: Former Smoker    Quit date: 06/04/2007  . Smokeless tobacco: Never Used  . Alcohol Use: No   OB History   Grav Para Term Preterm Abortions TAB SAB Ect Mult Living   5 5 1 4  0 0 0 0 0 5     Review of Systems  Constitutional: Negative for fever, chills, diaphoresis, appetite change and fatigue.  HENT: Negative for mouth sores, sore throat and trouble swallowing.   Eyes: Negative for visual  disturbance.  Respiratory: Negative for cough, chest tightness, shortness of breath and wheezing.   Cardiovascular: Negative for chest pain.  Gastrointestinal: Negative for nausea, vomiting, abdominal pain, diarrhea and abdominal distention.  Endocrine: Negative for polydipsia, polyphagia and polyuria.  Genitourinary: Negative for dysuria, frequency and hematuria.  Musculoskeletal: Negative for gait problem.  Skin: Negative for color change, pallor and rash.  Neurological: Positive for dizziness. Negative for syncope, light-headedness and headaches.  Hematological: Does not bruise/bleed easily.  Psychiatric/Behavioral: Negative for behavioral problems and confusion.      Allergies  Penicillins and Percocet  Home Medications   Prior to Admission medications   Medication Sig Start Date End Date Taking? Authorizing Provider  meclizine (ANTIVERT) 50 MG tablet Take 1 tablet (50 mg total) by mouth 3 (three) times daily as needed. 12/04/13   Rolland PorterMark Sartaj Hoskin, MD   BP 125/76  Pulse 88  Temp(Src) 99.4 F (37.4 C) (Oral)  Resp 14  SpO2 100%  LMP 11/30/2013 Physical Exam  Constitutional: She is oriented to person, place, and time. She appears well-developed and well-nourished. No distress.  HENT:  Head: Normocephalic.  Eyes: Conjunctivae are normal. Pupils are equal, round, and reactive to light. No scleral icterus.  Nystagmus  to lateral gaze.  Neck: Normal range of motion. Neck supple. No thyromegaly present.  Cardiovascular: Normal rate and regular rhythm.  Exam reveals no gallop and no friction rub.   No murmur heard. Pulmonary/Chest: Effort normal and breath sounds normal. No respiratory distress. She has no wheezes. She has no rales.  Abdominal: Soft. Bowel sounds are normal. She exhibits no distension. There is no tenderness. There is no rebound.  Musculoskeletal: Normal range of motion.  Neurological: She is alert and oriented to person, place, and time.  Skin: Skin is warm and dry.  No rash noted.  Psychiatric: She has a normal mood and affect. Her behavior is normal.    ED Course  Procedures (including critical care time) Labs Review Labs Reviewed  CBC - Abnormal; Notable for the following:    MCH 25.4 (*)    All other components within normal limits  BASIC METABOLIC PANEL - Abnormal; Notable for the following:    Potassium 3.4 (*)    Anion gap 16 (*)    All other components within normal limits  D-DIMER, QUANTITATIVE  TSH  HCG, SERUM, QUALITATIVE  I-STAT TROPOININ, ED  I-STAT TROPOININ, ED    Imaging Review No results found.   EKG Interpretation   Date/Time:  Saturday December 04 2013 16:41:00 EDT Ventricular Rate:  109 PR Interval:  188 QRS Duration: 79 QT Interval:  329 QTC Calculation: 443 R Axis:   52 Text Interpretation:  Sinus tachycardia Probable left atrial enlargement  Borderline T abnormalities, anterior leads ED PHYSICIAN INTERPRETATION  AVAILABLE IN [facility] Confirmed by TEST, Record ([id number]) on  12/06/2013 7:39:30 AM      MDM   Final diagnoses:  Vertigo   Not orthostatic. Studies are normal. Patient improved after fluids and Antivert. Heart rate improves. Vital signs stable. Vertigo improved. Think should appropriate for outpatient treatment. Antivert, primary care followup, recheck here if worsening symptoms.    Rolland Porter, MD 12/08/13 [phone]

## 2014-03-14 ENCOUNTER — Encounter: Payer: Self-pay | Admitting: Gynecology

## 2015-11-09 ENCOUNTER — Encounter: Payer: Self-pay | Admitting: Gynecology

## 2015-12-04 ENCOUNTER — Ambulatory Visit (INDEPENDENT_AMBULATORY_CARE_PROVIDER_SITE_OTHER): Payer: Self-pay | Admitting: Gynecology

## 2015-12-04 ENCOUNTER — Encounter: Payer: Self-pay | Admitting: Gynecology

## 2015-12-04 VITALS — BP 126/78 | Ht 63.75 in | Wt 224.0 lb

## 2015-12-04 DIAGNOSIS — Z01419 Encounter for gynecological examination (general) (routine) without abnormal findings: Secondary | ICD-10-CM

## 2015-12-04 DIAGNOSIS — N979 Female infertility, unspecified: Secondary | ICD-10-CM

## 2015-12-04 LAB — COMPREHENSIVE METABOLIC PANEL
ALBUMIN: 4 g/dL (ref 3.6–5.1)
ALK PHOS: 55 U/L (ref 33–115)
ALT: 35 U/L — AB (ref 6–29)
AST: 26 U/L (ref 10–30)
BUN: 13 mg/dL (ref 7–25)
CALCIUM: 8.6 mg/dL (ref 8.6–10.2)
CO2: 26 mmol/L (ref 20–31)
Chloride: 104 mmol/L (ref 98–110)
Creat: 0.68 mg/dL (ref 0.50–1.10)
Glucose, Bld: 82 mg/dL (ref 65–99)
Potassium: 4.6 mmol/L (ref 3.5–5.3)
SODIUM: 139 mmol/L (ref 135–146)
Total Bilirubin: 0.5 mg/dL (ref 0.2–1.2)
Total Protein: 6.9 g/dL (ref 6.1–8.1)

## 2015-12-04 LAB — TSH: TSH: 0.87 m[IU]/L

## 2015-12-04 LAB — CBC WITH DIFFERENTIAL/PLATELET
BASOS PCT: 0 %
Basophils Absolute: 0 cells/uL (ref 0–200)
EOS PCT: 3 %
Eosinophils Absolute: 258 cells/uL (ref 15–500)
HEMATOCRIT: 38.6 % (ref 35.0–45.0)
HEMOGLOBIN: 12.3 g/dL (ref 11.7–15.5)
LYMPHS ABS: 2838 {cells}/uL (ref 850–3900)
Lymphocytes Relative: 33 %
MCH: 24.9 pg — ABNORMAL LOW (ref 27.0–33.0)
MCHC: 31.9 g/dL — ABNORMAL LOW (ref 32.0–36.0)
MCV: 78.3 fL — ABNORMAL LOW (ref 80.0–100.0)
MONO ABS: 946 {cells}/uL (ref 200–950)
MPV: 9.8 fL (ref 7.5–12.5)
Monocytes Relative: 11 %
NEUTROS ABS: 4558 {cells}/uL (ref 1500–7800)
Neutrophils Relative %: 53 %
Platelets: 286 10*3/uL (ref 140–400)
RBC: 4.93 MIL/uL (ref 3.80–5.10)
RDW: 15.4 % — ABNORMAL HIGH (ref 11.0–15.0)
WBC: 8.6 10*3/uL (ref 3.8–10.8)

## 2015-12-04 LAB — LIPID PANEL
Cholesterol: 169 mg/dL (ref 125–200)
HDL: 57 mg/dL (ref >=46)
LDL Cholesterol: 79 mg/dL (ref <130)
TRIGLYCERIDES: 167 mg/dL — AB (ref <150)
Total CHOL/HDL Ratio: 3 Ratio (ref <=5.0)
VLDL: 33 mg/dL — AB (ref <30)

## 2015-12-04 MED ORDER — DOXYCYCLINE HYCLATE 100 MG PO CAPS: 100.0000 mg | ORAL_CAPSULE | Freq: Two times a day (BID) | ORAL | 0 refills | Status: DC

## 2015-12-04 NOTE — Progress Notes (Signed)
[name] 1975/10/27 [id number]   History:    40 y.o.  for annual gyn exam who has not been seen the office since 2015. Patient stated that last year she went to [place] had a tubal reversal. She divorced from her spouse 2 years ago and has been with a partner who is now 3 years of age and has started having unprotected intercourse since May of this year. She reports normal menstrual cycles. 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 Her followup Pap smears have been normal. Patient has also had a benign left breast biopsy in 1996.  Past medical history,surgical history, family history and social history were all reviewed and documented in the EPIC chart.  Gynecologic History Patient's last menstrual period was 11/07/2015 (exact date). Contraception: none Last Pap: 2015. Results were: normal Last mammogram: No previous study. Results were: No previous study  Obstetric History OB History  Gravida Para Term Preterm AB Living  '5 5 1 4 ' 0 5  SAB TAB Ectopic Multiple Live Births  0 0 0 0 1    # Outcome Date GA Lbr Len/2nd Weight Sex Delivery Anes PTL Lv  5 Term 01/30/11 [redacted]w[redacted]d 9 lb 3.1 oz (4.17 kg) M CS-LTranv Spinal  LIV  4 Preterm 07/08/07     Vag-Spont        Birth Comments: 320wks svd.   3 Preterm 07/30/97     Vag-Spont        Birth Comments: 36wks svd. placental abruption  2 Preterm 03/22/95     Vag-Spont        Birth Comments: preE. 36wks svd  1 Preterm 06/05/93     Vag-Spont        Birth Comments: 36wks svd.        ROS: A ROS was performed and pertinent positives and negatives are included in the history.  GENERAL: No fevers or chills. HEENT: No change in vision, no earache, sore throat or sinus congestion. NECK: No pain or stiffness. CARDIOVASCULAR: No chest pain or pressure. No palpitations. PULMONARY: No shortness of breath, cough or wheeze. GASTROINTESTINAL: No abdominal pain,  nausea, vomiting or diarrhea, melena or bright red blood per rectum. GENITOURINARY: No urinary frequency, urgency, hesitancy or dysuria. MUSCULOSKELETAL: No joint or muscle pain, no back pain, no recent trauma. DERMATOLOGIC: No rash, no itching, no lesions. ENDOCRINE: No polyuria, polydipsia, no heat or cold intolerance. No recent change in weight. HEMATOLOGICAL: No anemia or easy bruising or bleeding. NEUROLOGIC: No headache, seizures, numbness, tingling or weakness. PSYCHIATRIC: No depression, no loss of interest in normal activity or change in sleep pattern.     Exam: chaperone present  BP 126/78   Ht 5' 3.75" (1.619 m)   Wt 224 lb (101.6 kg)   LMP 11/07/2015 (Exact Date)   BMI 38.75 kg/m   Body mass index is 38.75 kg/m.  General appearance : Well developed well nourished female. No acute distress HEENT: Eyes: no retinal hemorrhage or exudates,  Neck supple, trachea midline, no carotid bruits, no thyroidmegaly Lungs: Clear to auscultation, no rhonchi or wheezes, or rib retractions  Heart: Regular rate and rhythm, no murmurs or gallops Breast:Examined in sitting and supine position were symmetrical in appearance, no palpable masses or tenderness,  no skin retraction, no nipple inversion, no nipple discharge, no skin discoloration, no axillary or supraclavicular lymphadenopathy Abdomen: no palpable masses or tenderness, no rebound or  guarding Extremities: no edema or skin discoloration or tenderness  Pelvic:  Bartholin, Urethra, Skene Glands: Within normal limits             Vagina: No gross lesions or discharge  Cervix: No gross lesions or discharge  Uterus  anteverted, normal size, shape and consistency, non-tender and mobile  Adnexa  Without masses or tenderness  Anus and perineum  normal   Rectovaginal  normal sphincter tone without palpated masses or tenderness             Hemoccult not indicated     Assessment/Plan:  40 y.o. female for annual exam will have the following  screening blood work done today: Comprehensive metabolic panel, fasting lipid profile, TSH, CBC, and urinalysis. Pap smear with HPV screening done today. Patient to schedule HSG with the start of her next menstrual cycle. I've given her prescription Vibramycin 100 mg to take 1 by mouth twice a day for 3 days starting the day before the procedure. We discussed importance of prenatal vitamins. We discussed importance of timing of intercourse with the use of the ovulation predictor kit. If she does not conceive spontaneously by December since she's having normal menstrual cycles on going to refer to the reproductive endocrinologist for consideration of in vitro fertilization. Literature information on IVF as well as some HSG was provided. We did discuss the detrimental effects of pregnancy at 40 years of age to include maternal hypertension, diabetes, fetal aneuploidy, miscarriage and premature delivery. Patient was given a requisition to schedule mammogram at time of her menstrual cycle.   Terrance Mass MD, 1:35 PM 12/04/2015

## 2015-12-04 NOTE — Patient Instructions (Signed)
In Vitro Fertilization  In vitro fertilization (IVF) is a type of assisted reproductive technology. IVF involves a series of procedures used to treat fertility or genetic problems in order to assist with the conception of a baby. During IVF, eggs are retrieved from the ovaries and combined with sperm in a lab to fertilize the eggs. One or more of the fertilized eggs (embryos) are inserted into the uterus through the cervix. Candidates for IVF include:  · People who are infertile.  · Women who underwent premature menopause or ovarian failure.  · Women who had both ovaries removed. In this case, donor eggs must be used.  · Women who have damaged or blocked fallopian tubes.  There is no age limit for having IVF, but it is not recommended for postmenopausal women. The ideal age for IVF is 35 years old or younger. Women age 41 and older are often counseled to consider using donor eggs during IVF to increase the chances of success.  LET YOUR HEALTH CARE PROVIDER KNOW ABOUT:   · Any allergies you have.  · All medicines you are taking, including vitamins, herbs, eye drops, creams, and over-the-counter medicines.  · Previous problems you or members of your family have had with the use of anesthetics.  · Any medical or genetic problems that you or members of your family have had.  · Any blood disorders you have.  · Previous surgeries you have had.  · Previous pregnancies you have had.  · Medical conditions you have.  · Any excessive alcohol drinking or smoking you have done.  · Any history of taking illegal drugs.  RISKS AND COMPLICATIONS   Generally, IVF procedures are safe. However, as with any procedure, complications can occur. Possible complications include:  · Bleeding and infection.  · Problems with anesthesia.  · Blood clots.  · The procedure not working.  · Having twins or multiples.  · Increased risk of early delivery.  BEFORE THE PROCEDURE   Before beginning a cycle of IVF, you and the donor may need various  screenings to make sure IVF is the correct treatment option. Some people may not benefit from this type of assisted reproductive technology.   · You and the donor will need to provide a complete medical history and the medical history of your families.  · You and the donor will undergo a physical exam.  · You and the donor may need blood tests to check for infectious diseases, including HIV.  · Other tests that may be done include:  ¨ Testing of your ovaries to determine the quality and quantity of your eggs.   ¨ Hormone tests and ovulation testing.  ¨ An exam of your uterus. This may be done by using ultrasound after liquid is injected into your uterus through your cervix (sonohysterography) or by using a thin, flexible tube with a tiny light and camera on the end of it (hysteroscope).  ¨ The donor's sperm will be taken and analyzed to see if they are normal, there are enough present to fertilize the egg, and they act normally after sexual intercourse (postcoital exam).  PROCEDURE   IVF involves several steps. These procedures can be done at the health care provider's office or clinic. One cycle of IVF can take about 2 weeks, and more than one cycle may be required. The steps of IVF are:  · Ovarian stimulation. If using your own eggs during IVF, at the start of a cycle you will begin treatment with man-made (synthetic) hormones.   ultrasound images as a guide, your health care provider will insert a thin needle through your vagina and into the ovary and sacs (follicles) containing the eggs. The needle is connected to a suction device, which pulls the eggs and fluid out of each follicle, one at a time. The procedure is repeated for the other ovary.  Insemination and  fertilization. The sperm is mixed together with your eggs (insemination) and stored in an environmentally controlled chamber.The sperm usually enters (fertilizes) an egg a few hours after insemination.  Embryo transfer. Your health care provider will place the embryos into your uterus using a thin tube (catheter) containing the embryos. The catheter is inserted into your vagina, through your cervix, and into your uterus. The embryo transfer usually takes place 2-6 days after the egg retrieval. If successful, the embryo will stick to (implant) in the lining of your uterus about 6-10 days after egg retrieval. If an embryo implants in the lining of the uterus and grows, pregnancy results. AFTER THE PROCEDURE   You may have to continue lying down for an hour or more before going home.  You may need to continue to take hormone therapy for about 3 months or as directed by your health care provider.  You may resume your normal diet and activities.   This information is not intended to replace advice given to you by your health care provider. Make sure you discuss any questions you have with your health care provider.   Document Released: 04/11/2008 Document Revised: 12/30/2012 Document Reviewed: 11/05/2012 Elsevier Interactive Patient Education 2016 Else[employer] Inc. Hysterosalpingography Hysterosalpingography is a procedure to look inside your uterus and fallopian tubes. During this procedure, contrast dye is injected into your uterus through your vagina and cervix to illuminate your uterus while X-ray pictures are taken. This procedure may help your health care provider determine whether you have uterine tumors, adhesions, or structural abnormalities. It is commonly used to help determine why a woman is unable to have children (infertility). The procedure usually lasts about 15-30 minutes. LET [facility] CARE PROVIDER KNOW ABOUT:  Any allergies you have.  All medicines you are taking, including  vitamins, herbs, eye drops, creams, and over-the-counter medicines.  Previous problems you or members of your family have had with the use of anesthetics.  Any blood disorders you have.  Previous surgeries you have had.  Medical conditions you have. RISKS AND COMPLICATIONS  Generally, this is a safe procedure. However, as with any procedure, problems can occur. Possible problems include:  Infection in the lining of the uterus (endometritis) or fallopian tubes (salpingitis).  Damage or perforation of the uterus or fallopian tubes.  An allergic reaction to the contrast dye used to perform the X-ray. BEFORE THE PROCEDURE   Schedule the procedure after your period stops, but before your next ovulation. This is usually between day 5 and day 10 of your last period. Day 1 is the first day of your period.  Ask your health care provider about changing or stopping your regular medicines.  You may eat and drink as normal.  Empty your bladder before the procedure begins. PROCEDURE  You may be given a medicine to relax you (sedative) or an over-the-counter pain medicine to lessen any discomfort during the procedure.  You will lie down on an X-ray table with your feet in stirrups.  A device called a speculum will be placed into your vagina. This allows your health care provider to see inside your vagina to the cervix.  The cervix will be washed with a special soap.  A thin, flexible tube will be passed through the cervix into the uterus.  Contrast dye will be put into this tube.  Several X-rays will be taken as the contrast dye spreads through the uterus and fallopian tubes.  The tube will be taken out after the procedure. AFTER THE PROCEDURE   Most of the contrast dye will flow out of your vagina naturally. You may want to wear a sanitary pad.  You may feel mild cramping and notice a little bleeding from your vagina. This should go away in 24 hours.  Ask when your test results  will be ready. Make sure you get your test results.   This information is not intended to replace advice given to you by your health care provider. Make sure you discuss any questions you have with your health care provider.   Document Released: 06/01/2004 Document Revised: 05/04/2013 Document Reviewed: 10/30/2012 Elsevier Interactive Patient Education [employer]

## 2015-12-05 ENCOUNTER — Other Ambulatory Visit: Payer: Self-pay | Admitting: Anesthesiology

## 2015-12-05 DIAGNOSIS — R748 Abnormal levels of other serum enzymes: Secondary | ICD-10-CM

## 2015-12-05 LAB — URINALYSIS W MICROSCOPIC + REFLEX CULTURE
Bacteria, UA: NONE SEEN [HPF]
Bilirubin Urine: NEGATIVE
CASTS: NONE SEEN [LPF]
Crystals: NONE SEEN [HPF]
GLUCOSE, UA: NEGATIVE
HGB URINE DIPSTICK: NEGATIVE
Ketones, ur: NEGATIVE
LEUKOCYTES UA: NEGATIVE
Nitrite: NEGATIVE
Protein, ur: NEGATIVE
RBC / HPF: NONE SEEN RBC/HPF (ref <=2)
Specific Gravity, Urine: 1.025 (ref 1.001–1.035)
WBC, UA: NONE SEEN WBC/HPF (ref <=5)
Yeast: NONE SEEN [HPF]
pH: 5.5 (ref 5.0–8.0)

## 2015-12-06 ENCOUNTER — Telehealth: Payer: Self-pay | Admitting: *Deleted

## 2015-12-06 ENCOUNTER — Other Ambulatory Visit: Payer: Self-pay

## 2015-12-06 ENCOUNTER — Other Ambulatory Visit: Payer: Self-pay | Admitting: Gynecology

## 2015-12-06 DIAGNOSIS — N979 Female infertility, unspecified: Secondary | ICD-10-CM

## 2015-12-06 DIAGNOSIS — R748 Abnormal levels of other serum enzymes: Secondary | ICD-10-CM

## 2015-12-06 LAB — PAP, TP IMAGING W/ HPV RNA, RFLX HPV TYPE 16,18/45: HPV mRNA, High Risk: NOT DETECTED

## 2015-12-06 NOTE — Telephone Encounter (Addendum)
Pt called today to have HSG scheduled at [facility] cycle started today per note on 12/04/15.   Patient to schedule HSG with the start of her next menstrual cycle. I've given her prescription Vibramycin 100 mg to take 1 by mouth twice a day for 3 days   Appointment on 12/13/15 @ 12:45pm at [facility] hospital pt aware direction on how to take vibramycin and no sexual intercourse prior to exam.

## 2015-12-07 ENCOUNTER — Other Ambulatory Visit: Payer: Self-pay | Admitting: Gynecology

## 2015-12-07 DIAGNOSIS — R7989 Other specified abnormal findings of blood chemistry: Secondary | ICD-10-CM

## 2015-12-07 DIAGNOSIS — R945 Abnormal results of liver function studies: Principal | ICD-10-CM

## 2015-12-07 LAB — AST: AST: 30 U/L (ref 10–30)

## 2015-12-07 LAB — ALT: ALT: 35 U/L — ABNORMAL HIGH (ref 6–29)

## 2015-12-08 LAB — HEPATITIS PANEL, ACUTE
HCV Ab: NEGATIVE
HEP B S AG: NEGATIVE
Hep A IgM: NONREACTIVE
Hep B C IgM: NONREACTIVE

## 2015-12-13 ENCOUNTER — Ambulatory Visit (HOSPITAL_COMMUNITY)
Admission: RE | Admit: 2015-12-13 | Discharge: 2015-12-13 | Disposition: A | Discharge status: Home / Self Care | Payer: BC Managed Care – PPO | Source: Ambulatory Visit | Attending: Gynecology | Admitting: Gynecology

## 2015-12-13 DIAGNOSIS — N979 Female infertility, unspecified: Secondary | ICD-10-CM

## 2015-12-13 DIAGNOSIS — Z3141 Encounter for fertility testing: Secondary | ICD-10-CM | POA: Insufficient documentation

## 2015-12-13 MED ORDER — IOPAMIDOL (ISOVUE-300) INJECTION 61%
30.0000 mL | Freq: Once | INTRAVENOUS | Status: AC | PRN
  Administered 2015-12-13: 10 mL

## 2016-01-08 ENCOUNTER — Other Ambulatory Visit: Payer: Self-pay

## 2016-01-08 DIAGNOSIS — R945 Abnormal results of liver function studies: Principal | ICD-10-CM

## 2016-01-08 DIAGNOSIS — R7989 Other specified abnormal findings of blood chemistry: Secondary | ICD-10-CM

## 2016-01-08 LAB — ALT: ALT: 56 U/L — ABNORMAL HIGH (ref 6–29)

## 2016-01-08 LAB — AST: AST: 45 U/L — ABNORMAL HIGH (ref 10–30)

## 2016-01-09 ENCOUNTER — Other Ambulatory Visit: Payer: Self-pay

## 2016-01-31 ENCOUNTER — Telehealth: Payer: Self-pay | Admitting: *Deleted

## 2016-01-31 DIAGNOSIS — R7989 Other specified abnormal findings of blood chemistry: Secondary | ICD-10-CM

## 2016-01-31 DIAGNOSIS — R945 Abnormal results of liver function studies: Principal | ICD-10-CM

## 2016-01-31 NOTE — Telephone Encounter (Signed)
Referral placed at [facility] they will contact to schedule.

## 2016-01-31 NOTE — Telephone Encounter (Signed)
-----   Message from Keenan BachelorKatherine R Annas, [place] sent at 01/31/2016 12:18 PM EDT ----- Regarding: referral to [facility] Per Dr. Glenetta HewjF "Please inform patient that her to liver function tests are still elevated her hepatitis panel was negative and I would like to refer her to a gastroenterologist for further evaluation of her elevated liver function test. Please make an appointment for her at the  GI."   I spoke with patient and she if okay with scheduling and knows you will schedule. Thanks!!!

## 2016-02-19 NOTE — Telephone Encounter (Signed)
[place] has left a message for pt to call.

## 2016-03-15 NOTE — Telephone Encounter (Signed)
[place] has called patient several times and left message, pt has not yet returned the phone call.

## 2016-04-01 ENCOUNTER — Encounter: Payer: Self-pay | Admitting: Gynecology

## 2016-05-02 ENCOUNTER — Encounter: Payer: Self-pay | Admitting: Gastroenterology

## 2016-05-23 ENCOUNTER — Telehealth: Payer: Self-pay | Admitting: *Deleted

## 2016-05-23 NOTE — Telephone Encounter (Signed)
You did not state which vaccine but if it is the flu vaccine she can have it

## 2016-05-23 NOTE — Telephone Encounter (Signed)
Pt informed with the below note. 

## 2016-05-23 NOTE — Telephone Encounter (Signed)
If they test her and she is not immune she would get the MMR (measels, mumps, and rubella)vaccine . Since this is a live attenuated virus vaccine it is recommended she wait 30 days after injection before attempting pregnancy.

## 2016-05-23 NOTE — Telephone Encounter (Signed)
Left message for pt to call.

## 2016-05-23 NOTE — Telephone Encounter (Signed)
Sorry about that, its the measles vaccine

## 2016-05-23 NOTE — Telephone Encounter (Signed)
Pt started new job at [employer], she is trying to conceive [facility] needs a note from you stating your approval or denial for her to receive this vaccination. Let me know and I will send note.  Please advise

## 2016-06-03 ENCOUNTER — Encounter: Payer: Self-pay | Admitting: Gastroenterology

## 2016-06-03 ENCOUNTER — Ambulatory Visit (INDEPENDENT_AMBULATORY_CARE_PROVIDER_SITE_OTHER): Payer: 59 | Admitting: Gastroenterology

## 2016-06-03 ENCOUNTER — Other Ambulatory Visit (INDEPENDENT_AMBULATORY_CARE_PROVIDER_SITE_OTHER): Payer: 59

## 2016-06-03 VITALS — BP 104/68 | HR 96 | Ht 63.75 in | Wt 231.0 lb

## 2016-06-03 DIAGNOSIS — R7401 Elevation of levels of liver transaminase levels: Secondary | ICD-10-CM

## 2016-06-03 DIAGNOSIS — E669 Obesity, unspecified: Secondary | ICD-10-CM | POA: Diagnosis not present

## 2016-06-03 DIAGNOSIS — R740 Nonspecific elevation of levels of transaminase and lactic acid dehydrogenase [LDH]: Secondary | ICD-10-CM

## 2016-06-03 DIAGNOSIS — E663 Overweight: Secondary | ICD-10-CM

## 2016-06-03 LAB — IBC PANEL
IRON: 46 ug/dL (ref 42–145)
Saturation Ratios: 9.2 % — ABNORMAL LOW (ref 20.0–50.0)
TRANSFERRIN: 358 mg/dL (ref 212.0–360.0)

## 2016-06-03 LAB — HEPATIC FUNCTION PANEL
ALT: 60 U/L — ABNORMAL HIGH (ref 0–35)
AST: 52 U/L — AB (ref 0–37)
Albumin: 4.2 g/dL (ref 3.5–5.2)
Alkaline Phosphatase: 60 U/L (ref 39–117)
Bilirubin, Direct: 0.1 mg/dL (ref 0.0–0.3)
Total Bilirubin: 0.4 mg/dL (ref 0.2–1.2)
Total Protein: 7.9 g/dL (ref 6.0–8.3)

## 2016-06-03 LAB — HEMOGLOBIN A1C: Hgb A1c MFr Bld: 5.2 % (ref 4.6–6.5)

## 2016-06-03 LAB — FERRITIN: FERRITIN: 22.3 ng/mL (ref 10.0–291.0)

## 2016-06-03 NOTE — Patient Instructions (Signed)
If you are age 41 or older, your body mass index should be between 23-30. Your Body mass index is 39.96 kg/m. If this is out of the aforementioned range listed, please consider follow up with your Primary Care Provider.  If you are age 41 or younger, your body mass index should be between 19-25. Your Body mass index is 39.96 kg/m. If this is out of the aformentioned range listed, please consider follow up with your Primary Care Provider.   Your physician has requested that you go to the basement for lab work before leaving today.  [facility] on Friday June 07, 2016 at 8:30am. Please arrive 15 minutes prior to your appointment for registration. Make certain not to have anything to eat or drink 6 hours prior to your appointment. Should you need to reschedule your appointment, please contact radiology at [phone]. This test typically takes about 30 minutes to perform.  We have referred you to a nutritionist. Please contact our office if an appointment has not been made in the next 2 weeks.  Thank you.

## 2016-06-03 NOTE — Progress Notes (Signed)
HPI :  41 y/o female with a history of obesity, abnormal Pap smear status post LEEP, referred here for a new patient evaluation for abnormal liver enzymes.  She thinks new over the past summer. ALt reported to be high 30s to mid 50s. AP and bili normal. No history of liver disease. Grandmother on mother's side had alcoholic cirrhosis. No FH of HCC. No history of jaundice. No history of blood transfusion. History of prior tatoo. She's has tested negative for hepatitis B and hepatitis C.   She has history of cholecystectomy. No medications she takes. No OTCS or over the counter. She has 3-4 beers / month.  She has gained weight in the past year or so, 60 lbs or so. She has tried doing Psychologist, educationalZumba / treadmill, having a hard time with this. She has not seen a nutritionist in the past or dietary plans.      Past Medical History:  Diagnosis Date  . Abnormal Pap smear 2007   colpo, leep  . No pertinent past medical history   . Preterm labor      Past Surgical History:  Procedure Laterality Date  . BREAST BIOPSY  1996   right  breast  . CERVICAL BIOPSY  W/ LOOP ELECTRODE EXCISION    . CESAREAN SECTION  01/30/2011   Procedure: CESAREAN SECTION;  Surgeon: Oliver PilaKathy W Richardson;  Location: [facility];  Service: Gynecology;  Laterality: N/A;  Repeat with Bilateral Tubal Ligation   . CHOLECYSTECTOMY  2006  . CHOLECYSTECTOMY, LAPAROSCOPIC    . LEEP    . TUBAL LIGATION  1999   tubal reversal in 2006  . tubal ligation reversal Bilateral   . tubal reversal     Family History  Problem Relation Age of Onset  . Heart disease Mother   . COPD Mother   . Heart disease Father   . Breast cancer Maternal Grandmother   . Cirrhosis Maternal Grandmother     alcoholic  . Breast cancer Paternal Grandmother   . Alzheimer's disease Paternal Grandmother   . Colon cancer Maternal Grandfather   . Prostate cancer Maternal Grandfather   . Stomach cancer Neg Hx   . Rectal cancer Neg Hx   . Esophageal cancer Neg Hx     . Liver cancer Neg Hx    Social History  Substance Use Topics  . Smoking status: Former Smoker    Quit date: 06/04/2007  . Smokeless tobacco: Never Used  . Alcohol use Yes     Comment: socially   No current outpatient prescriptions on file.   No current facility-administered medications for this visit.    Allergies  Allergen Reactions  . Penicillins Swelling    Throat swelling  . Percocet [Oxycodone-Acetaminophen] Other (See Comments)    migraines     Review of Systems: All systems reviewed and negative except where noted in HPI.   Lab Results  Component Value Date   ALT 56 (H) 01/08/2016   AST 45 (H) 01/08/2016   ALKPHOS 55 12/04/2015   BILITOT 0.5 12/04/2015   Lab Results  Component Value Date   WBC 8.6 12/04/2015   HGB 12.3 12/04/2015   HCT 38.6 12/04/2015   MCV 78.3 (L) 12/04/2015   PLT 286 12/04/2015    Lab Results  Component Value Date   CREATININE 0.68 12/04/2015   BUN 13 12/04/2015   NA 139 12/04/2015   K 4.6 12/04/2015   CL 104 12/04/2015   CO2 26 12/04/2015  Physical Exam: BP 104/68   Pulse 96   Ht 5' 3.75" (1.619 m)   Wt 231 lb (104.8 kg)   BMI 39.96 kg/m  Constitutional: Pleasant,well-developed, female in no acute distress. HEENT: Normocephalic and atraumatic. Conjunctivae are normal. No scleral icterus. Neck supple.  Cardiovascular: Normal rate, regular rhythm.  Pulmonary/chest: Effort normal and breath sounds normal. No wheezing, rales or rhonchi. Abdominal: Soft, protuberant, nontender.  There are no masses palpable. No hepatomegaly. Extremities: no edema Lymphadenopathy: No cervical adenopathy noted. Neurological: Alert and oriented to person place and time. Skin: Skin is warm and dry. No rashes noted. Psychiatric: Normal mood and affect. Behavior is normal.   ASSESSMENT AND PLAN: 41 year old female here for assessment of the following issues:  Elevated ALT - I suspect this is most likely due to fatty liver and she needs  to be evaluated for this with an ultrasound. I discussed spectrum of fatty liver disease with her, and discussed other potential etiologies with her and will screen her for other chronic liver diseases with labs. Once I get the results I will relay them to her. We discussed that if she does have fatty liver recommendations will be for weight loss through diet and exercise which will be the mainstay of therapy. She should minimize alcohol use and routine consumption of coffee is good for the liver.  Obesity - we discussed options for weight loss. She does routinely exercise however has not had any counseling on dieting. I will refer her to a nutritionist to assess with weight loss. If no benefit with this could consider medical options or even bariatric surgery if BMI > 40. Will also screen her for DM.  Ileene Patrick, MD [facility] Gastroenterology Pager [phone]

## 2016-06-04 LAB — ANA: Anti Nuclear Antibody(ANA): NEGATIVE

## 2016-06-04 LAB — IGG: IGG (IMMUNOGLOBIN G), SERUM: 1455 mg/dL (ref 694–1618)

## 2016-06-05 LAB — ALPHA-1-ANTITRYPSIN: A-1 Antitrypsin, Ser: 196 mg/dL (ref 83–199)

## 2016-06-05 LAB — ANTI-SMOOTH MUSCLE ANTIBODY, IGG: Smooth Muscle Ab: <20 U (ref <20)

## 2016-06-05 LAB — CERULOPLASMIN: CERULOPLASMIN: 41 mg/dL (ref 18–53)

## 2016-06-06 ENCOUNTER — Telehealth: Payer: Self-pay

## 2016-06-06 NOTE — Telephone Encounter (Signed)
Pt informed of normal results and that she will be contacted after u/s result is reviewed.

## 2016-06-06 NOTE — Telephone Encounter (Signed)
-----   Message from Ruffin FrederickSteven Paul Armbruster, MD sent at 06/05/2016  5:21 PM EST ----- Morrie SheldonAshley can you please relay to this patient that her liver enzymes are stable and labs for chronic liver diseases are negative.  I will await results of her US and she will be contacted with the results. Thanks

## 2016-06-07 ENCOUNTER — Ambulatory Visit (HOSPITAL_COMMUNITY): Payer: 59

## 2016-07-05 ENCOUNTER — Encounter: Payer: 59 | Attending: Gastroenterology | Admitting: Dietician

## 2016-07-05 ENCOUNTER — Encounter: Payer: Self-pay | Admitting: Dietician

## 2016-07-05 DIAGNOSIS — E6609 Other obesity due to excess calories: Secondary | ICD-10-CM

## 2016-07-05 DIAGNOSIS — Z713 Dietary counseling and surveillance: Secondary | ICD-10-CM | POA: Insufficient documentation

## 2016-07-05 DIAGNOSIS — Z6839 Body mass index (BMI) 39.0-39.9, adult: Secondary | ICD-10-CM

## 2016-07-05 NOTE — Progress Notes (Signed)
  Medical Nutrition Therapy:  Appt start time: 9826 end time:  4158.   Assessment:  Primary concerns today: Patient is here alone.  She has increased liver enzymes.  She has gained 41 lbs in the past year and is unable to lose weight despite exercise.  She was not sure how she gained weight.  She wants to learn how to control her appetite and kick start her metabolism.  She has increased liver enzymes presumed to be fatty liver.  Weight hx: 41 lbs today which is highest weight 140 lbs is lowest adult weight.  Patient lives with her husband, 30 yo and 54 yo children.  She has 3 other grown children.  Patient cooks and shops.  She is allergic to shellfish.  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  Preferred Learning Style:   No preference indicated   Learning Readiness:   Ready  MEDICATIONS: none   DIETARY INTAKE:  Usual eating pattern includes 2 meals and 2 snacks per day. Everyday foods include eats out often.  Avoided foods include shellfish.    24-hr recall:  B ( AM): skips  Snk ( AM): none L ( PM): Large chili and baked potato with 2 large sweet teas from [employer] Snk ( PM):  Vegetables, fruit, dessert, pasta salad D ( PM): [place] OR hotdogs and mac and cheese Snk ( PM): popcorn or popcorn Beverages: sweet tea, water, apple juice, occasional 2% milk  Usual physical activity: Zumba once per week, treadmill (30 minutes 3 times per week)  Progress Towards Goal(s):  In progress.   Nutritional Diagnosis:  NB-1.1 Food and nutrition-related knowledge deficit As related to weight management.  As evidenced by diet hx.    Intervention:  Nutrition counseling/education related to heathy food choices and mindful eating.  Discussed importance of adequate sleep and continued active lifestyle.  She often eats dinner between 9-10 every night since getting married last year with resulting 60 lb weight gain.  Discussed alternative meal schedule.  Continue  an active lifestyle Calorie king.com or google the restaurant name and nutritional to get nutritional information about the foods you are eating out. Rethink what you are drinking. Avoid skipping meals. Be mindful about the choices that you are making when you are eating out. Eat slowly, stop when you are satisfied. Increase non-starchy vegetable intake. Bake, broil, grill, rather than fry.  Watch the portion size of your butter. Consider reading Drue Stager book on family meals.  Teaching Method Utilized:  Visual Auditory Hands on  Handouts given during visit include:  Breakfast ideas  My plate  Snack list  Barriers to learning/adherence to lifestyle change: none  Demonstrated degree of understanding via:  Teach Back   Monitoring/Evaluation:  Dietary intake, exercise, and body weight prn.

## 2016-07-05 NOTE — Patient Instructions (Addendum)
Continue an active lifestyle Calorie king.com or google the restaurant name and nutritional to get nutritional information about the foods you are eating out. Rethink what you are drinking. Avoid skipping meals. Be mindful about the choices that you are making when you are eating out. Eat slowly, stop when you are satisfied. Increase non-starchy vegetable intake. Bake, broil, grill, rather than fry.  Watch the portion size of your butter. Consider reading Mikeal HawthorneEllyn Satter book on family meals.

## 2016-07-31 ENCOUNTER — Telehealth: Payer: Self-pay | Admitting: *Deleted

## 2016-07-31 NOTE — Telephone Encounter (Signed)
Pt called requesting referral for to Dr.Yalcinkaya office, okay to proceed? Please advise

## 2016-07-31 NOTE — Telephone Encounter (Signed)
Notes faxed to Dr.Y office they will contact pt to schedule,pt aware of this as well.

## 2016-07-31 NOTE — Telephone Encounter (Signed)
Yes please

## 2016-08-22 NOTE — Telephone Encounter (Signed)
Pt scheduled on 10/02/16 @ 1:30pm at [facility] office location

## 2016-09-25 ENCOUNTER — Encounter: Payer: Self-pay | Admitting: Gynecology

## 2016-10-02 DIAGNOSIS — N971 Female infertility of tubal origin: Secondary | ICD-10-CM | POA: Diagnosis not present

## 2016-10-02 DIAGNOSIS — E288 Other ovarian dysfunction: Secondary | ICD-10-CM | POA: Diagnosis not present

## 2016-10-02 DIAGNOSIS — Z319 Encounter for procreative management, unspecified: Secondary | ICD-10-CM | POA: Diagnosis not present

## 2016-10-02 DIAGNOSIS — Z31430 Encounter of female for testing for genetic disease carrier status for procreative management: Secondary | ICD-10-CM | POA: Diagnosis not present

## 2016-10-05 DIAGNOSIS — Z3189 Encounter for other procreative management: Secondary | ICD-10-CM | POA: Diagnosis not present

## 2016-10-31 DIAGNOSIS — Z319 Encounter for procreative management, unspecified: Secondary | ICD-10-CM | POA: Diagnosis not present

## 2016-10-31 DIAGNOSIS — Z113 Encounter for screening for infections with a predominantly sexual mode of transmission: Secondary | ICD-10-CM | POA: Diagnosis not present

## 2016-10-31 DIAGNOSIS — Z3141 Encounter for fertility testing: Secondary | ICD-10-CM | POA: Diagnosis not present

## 2016-11-01 DIAGNOSIS — N8500 Endometrial hyperplasia, unspecified: Secondary | ICD-10-CM | POA: Diagnosis not present

## 2016-11-14 DIAGNOSIS — Z3141 Encounter for fertility testing: Secondary | ICD-10-CM | POA: Diagnosis not present

## 2016-11-14 DIAGNOSIS — Z319 Encounter for procreative management, unspecified: Secondary | ICD-10-CM | POA: Diagnosis not present

## 2016-11-14 DIAGNOSIS — E288 Other ovarian dysfunction: Secondary | ICD-10-CM | POA: Diagnosis not present

## 2016-11-14 DIAGNOSIS — Z3183 Encounter for assisted reproductive fertility procedure cycle: Secondary | ICD-10-CM | POA: Diagnosis not present

## 2016-11-22 DIAGNOSIS — Z3183 Encounter for assisted reproductive fertility procedure cycle: Secondary | ICD-10-CM | POA: Diagnosis not present

## 2016-11-22 DIAGNOSIS — E288 Other ovarian dysfunction: Secondary | ICD-10-CM | POA: Diagnosis not present

## 2016-11-25 DIAGNOSIS — Z3183 Encounter for assisted reproductive fertility procedure cycle: Secondary | ICD-10-CM | POA: Diagnosis not present

## 2016-11-25 DIAGNOSIS — E288 Other ovarian dysfunction: Secondary | ICD-10-CM | POA: Diagnosis not present

## 2016-11-27 DIAGNOSIS — E288 Other ovarian dysfunction: Secondary | ICD-10-CM | POA: Diagnosis not present

## 2016-11-27 DIAGNOSIS — Z3183 Encounter for assisted reproductive fertility procedure cycle: Secondary | ICD-10-CM | POA: Diagnosis not present

## 2016-11-28 DIAGNOSIS — E288 Other ovarian dysfunction: Secondary | ICD-10-CM | POA: Diagnosis not present

## 2016-11-28 DIAGNOSIS — Z3183 Encounter for assisted reproductive fertility procedure cycle: Secondary | ICD-10-CM | POA: Diagnosis not present

## 2017-08-06 ENCOUNTER — Telehealth: Payer: Self-pay | Admitting: *Deleted

## 2017-08-06 NOTE — Telephone Encounter (Signed)
-----   Message from Benancio DeedsSteven P Armbruster, MD sent at 08/06/2017  7:35 AM EDT ----- Jan I got a message from the system that this patient never followed up for her US of her liver. She is due for repeat LFTs and US of the liver if she wishes to proceed. Thanks  ----- Message ----- From: SYSTEM Sent: 08/06/2017  12:08 AM To: Benancio DeedsSteven P Armbruster, MD

## 2017-08-06 NOTE — Telephone Encounter (Signed)
I have left a voicemail for patient to call back. 

## 2017-08-07 NOTE — Telephone Encounter (Signed)
Left voicemail for patient to call back. 

## 2017-08-08 NOTE — Telephone Encounter (Signed)
Left voicemail for patient to call back. I will also send a letter to her home address since we have been unable to speak with her by phone.

## 2017-08-20 ENCOUNTER — Ambulatory Visit (INDEPENDENT_AMBULATORY_CARE_PROVIDER_SITE_OTHER): Payer: Self-pay | Admitting: Gynecology

## 2017-08-20 ENCOUNTER — Encounter: Payer: Self-pay | Admitting: Gynecology

## 2017-08-20 VITALS — BP 124/80 | Ht 63.0 in | Wt 240.0 lb

## 2017-08-20 DIAGNOSIS — N970 Female infertility associated with anovulation: Secondary | ICD-10-CM

## 2017-08-20 DIAGNOSIS — Z01419 Encounter for gynecological examination (general) (routine) without abnormal findings: Secondary | ICD-10-CM

## 2017-08-20 DIAGNOSIS — Z13220 Encounter for screening for lipoid disorders: Secondary | ICD-10-CM

## 2017-08-20 NOTE — Progress Notes (Signed)
    [name] [birth year] [id number]        42 y.o.  [id number] for annual gynecologic exam.  Former patient of Dr. Lily PeerFernandez.  Patient has questions about trying to get pregnant.  Past medical history,surgical history, problem list, medications, allergies, family history and social history were all reviewed and documented as reviewed in the EPIC chart.  ROS:  Performed with pertinent positives and negatives included in the history, assessment and plan.   Additional significant findings : None   Exam: Kennon PortelaKim Gardner assistant Vitals:   08/20/17 1535  BP: 124/80  Weight: 240 lb (108.9 kg)  Height: 5\' 3"  (1.6 m)   Body mass index is 42.51 kg/m.  General appearance:  Normal affect, orientation and appearance. Skin: Grossly normal HEENT: Without gross lesions.  No cervical or supraclavicular adenopathy. Thyroid normal.  Lungs:  Clear without wheezing, rales or rhonchi Cardiac: RR, without RMG Abdominal:  Soft, nontender, without masses, guarding, rebound, organomegaly or hernia Breasts:  Examined lying and sitting without masses, retractions, discharge or axillary adenopathy. Pelvic:  Ext, BUS, Vagina: Normal with light menstrual flow  Cervix: Normal with light menstrual flow.  Pap smear done  Uterus: Anteverted, normal size, midline mobile nontender  Adnexa: Without masses or tenderness    Anus and perineum: Normal   Rectovaginal: Normal sphincter tone without palpated masses or tenderness.    Assessment/Plan:  42 y.o. [name] female for annual gynecologic exam with regular menses, no contraception.   1. Secondary infertility.  Patient has a history of tubal ligation x2 and tubal reversal x2.  Her last reversal was in 2017.  She had a follow-up HSG which showed patent tubes.  She had followed up with Dr. April MansonYalcinkaya who apparently ran a stimulated cycle but unfortunately aborted it near the end.  The patient has never followed back up with him.  Her menses are regular although she  notes that they are starting to space out to every 30-40 days over the last 2-3 cycles.  Otherwise were less than 30 days monthly..  No prolonged or intermenstrual bleeding.  We discussed decreasing fecundity with advancing maternal age.  She is spacing her cycles out although no prolonged skips or atypical bleeding.  Will check baseline FSH and TSH.  Do not think ovulatory stimulants such as Clomid would be of much benefit at this point given she is having regular menses.  My recommendation is to follow-up with Dr. April MansonYalcinkaya.  I also recommend patient continue on a multivitamin/prenatal vitamin with folic acid for preconceptual vitamin supplementation. 2. Pap smear/HPV 2017.  Pap smear done today.  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 Izaia Say NoFernandez's noted. 3. Mammography never.  I recommended patient schedule a screening mammogram and she agrees to do so.  Names and numbers provided.  Breast exam normal today. 4. Health maintenance.  Patient requests baseline labs.  CBC, CMP, lipid profile, TSH and FSH ordered.  Follow-up in 1 year, sooner as needed.   Dara Lordsimothy P Krisanne Lich MD, 4:14 PM 08/20/2017

## 2017-08-20 NOTE — Patient Instructions (Signed)
Call to Schedule your mammogram  Facilities in [place]: 1)  [facility], [address] Phone: [phone] 2)  Dr. Bertrand at [facility]  [address] Phone: [phone]     Mammogram A mammogram is an X-ray test to find changes in a woman's breast. You should get a mammogram if:  You are 42 years of age or older  You have risk factors.   Your doctor recommends that you have one.  BEFORE THE TEST  Do not schedule the test the week before your period, especially if your breasts are sore during this time.  On the day of your mammogram:  Wash your breasts and armpits well. After washing, do not put on any deodorant or talcum powder on until after your test.   Eat and drink as you usually do.   Take your medicines as usual.   If you are diabetic and take insulin, make sure you:   Eat before coming for your test.   Take your insulin as usual.   If you cannot keep your appointment, call before the appointment to cancel. Schedule another appointment.  TEST  You will need to undress from the waist up. You will put on a hospital gown.   Your breast will be put on the mammogram machine, and it will press firmly on your breast with a piece of plastic called a compression paddle. This will make your breast flatter so that the machine can X-ray all parts of your breast.   Both breasts will be X-rayed. Each breast will be X-rayed from above and from the side. An X-ray might need to be taken again if the picture is not good enough.   The mammogram will last about 15 to 30 minutes.  AFTER THE TEST Finding out the results of your test Ask when your test results will be ready. Make sure you get your test results.  Document Released: 07/26/2008 Document Revised: 04/18/2011 Document Reviewed: 07/26/2008 ExitCare Patient Information 2012 [employer].   

## 2017-08-20 NOTE — Addendum Note (Signed)
Addended by: Dayna BarkerGARDNER, KIMBERLY K on: 08/20/2017 04:28 PM   Modules accepted: Orders

## 2017-08-21 ENCOUNTER — Other Ambulatory Visit: Payer: Self-pay | Admitting: Gynecology

## 2017-08-21 DIAGNOSIS — E7800 Pure hypercholesterolemia, unspecified: Secondary | ICD-10-CM

## 2017-08-21 LAB — CBC WITH DIFFERENTIAL/PLATELET
BASOS PCT: 0.5 %
Basophils Absolute: 48 cells/uL (ref 0–200)
EOS PCT: 2.6 %
Eosinophils Absolute: 247 cells/uL (ref 15–500)
HCT: 40.6 % (ref 35.0–45.0)
HEMOGLOBIN: 13.4 g/dL (ref 11.7–15.5)
Lymphs Abs: 2955 cells/uL (ref 850–3900)
MCH: 25.7 pg — ABNORMAL LOW (ref 27.0–33.0)
MCHC: 33 g/dL (ref 32.0–36.0)
MCV: 77.8 fL — ABNORMAL LOW (ref 80.0–100.0)
MONOS PCT: 11.3 %
MPV: 10.6 fL (ref 7.5–12.5)
NEUTROS ABS: 5178 {cells}/uL (ref 1500–7800)
Neutrophils Relative %: 54.5 %
Platelets: 353 10*3/uL (ref 140–400)
RBC: 5.22 10*6/uL — ABNORMAL HIGH (ref 3.80–5.10)
RDW: 14 % (ref 11.0–15.0)
Total Lymphocyte: 31.1 %
WBC mixed population: 1074 cells/uL — ABNORMAL HIGH (ref 200–950)
WBC: 9.5 10*3/uL (ref 3.8–10.8)

## 2017-08-21 LAB — LIPID PANEL
Cholesterol: 205 mg/dL — ABNORMAL HIGH (ref <200)
HDL: 57 mg/dL (ref >50)
LDL CHOLESTEROL (CALC): 115 mg/dL — AB
Non-HDL Cholesterol (Calc): 148 mg/dL (calc) — ABNORMAL HIGH (ref <130)
TRIGLYCERIDES: 212 mg/dL — AB (ref <150)
Total CHOL/HDL Ratio: 3.6 (calc) (ref <5.0)

## 2017-08-21 LAB — COMPREHENSIVE METABOLIC PANEL
AG RATIO: 1.2 (calc) (ref 1.0–2.5)
ALT: 177 U/L — AB (ref 6–29)
AST: 191 U/L — ABNORMAL HIGH (ref 10–30)
Albumin: 4.3 g/dL (ref 3.6–5.1)
Alkaline phosphatase (APISO): 83 U/L (ref 33–115)
BUN: 12 mg/dL (ref 7–25)
CO2: 25 mmol/L (ref 20–32)
CREATININE: 0.7 mg/dL (ref 0.50–1.10)
Calcium: 9.8 mg/dL (ref 8.6–10.2)
Chloride: 103 mmol/L (ref 98–110)
GLUCOSE: 80 mg/dL (ref 65–99)
Globulin: 3.6 g/dL (calc) (ref 1.9–3.7)
Potassium: 4.4 mmol/L (ref 3.5–5.3)
Sodium: 140 mmol/L (ref 135–146)
TOTAL PROTEIN: 7.9 g/dL (ref 6.1–8.1)
Total Bilirubin: 0.5 mg/dL (ref 0.2–1.2)

## 2017-08-21 LAB — FOLLICLE STIMULATING HORMONE: FSH: 6.6 m[IU]/mL

## 2017-08-21 LAB — TSH: TSH: 0.86 m[IU]/L

## 2017-08-22 ENCOUNTER — Other Ambulatory Visit: Payer: Self-pay | Admitting: Gynecology

## 2017-08-22 DIAGNOSIS — E782 Mixed hyperlipidemia: Secondary | ICD-10-CM

## 2017-08-22 DIAGNOSIS — E7800 Pure hypercholesterolemia, unspecified: Secondary | ICD-10-CM

## 2017-08-22 LAB — PAP IG W/ RFLX HPV ASCU

## 2018-08-24 ENCOUNTER — Encounter: Payer: Self-pay | Admitting: Gynecology

## 2019-02-02 ENCOUNTER — Encounter: Payer: Self-pay | Admitting: Gynecology

## 2019-09-03 ENCOUNTER — Encounter (HOSPITAL_COMMUNITY): Payer: Self-pay | Admitting: Emergency Medicine

## 2019-09-03 ENCOUNTER — Emergency Department (HOSPITAL_COMMUNITY)
Admission: EM | Admit: 2019-09-03 | Discharge: 2019-09-04 | Disposition: A | Discharge status: Home / Self Care | Payer: Self-pay | Attending: Emergency Medicine | Admitting: Emergency Medicine

## 2019-09-03 ENCOUNTER — Other Ambulatory Visit: Payer: Self-pay

## 2019-09-03 ENCOUNTER — Emergency Department (HOSPITAL_COMMUNITY): Payer: Self-pay

## 2019-09-03 DIAGNOSIS — M549 Dorsalgia, unspecified: Secondary | ICD-10-CM | POA: Insufficient documentation

## 2019-09-03 DIAGNOSIS — Z87891 Personal history of nicotine dependence: Secondary | ICD-10-CM | POA: Insufficient documentation

## 2019-09-03 DIAGNOSIS — S29019A Strain of muscle and tendon of unspecified wall of thorax, initial encounter: Secondary | ICD-10-CM

## 2019-09-03 DIAGNOSIS — R0789 Other chest pain: Secondary | ICD-10-CM | POA: Insufficient documentation

## 2019-09-03 DIAGNOSIS — R079 Chest pain, unspecified: Secondary | ICD-10-CM

## 2019-09-03 LAB — CBC
HCT: 41.2 % (ref 36.0–46.0)
Hemoglobin: 12.7 g/dL (ref 12.0–15.0)
MCH: 25.1 pg — ABNORMAL LOW (ref 26.0–34.0)
MCHC: 30.8 g/dL (ref 30.0–36.0)
MCV: 81.4 fL (ref 80.0–100.0)
Platelets: 372 10*3/uL (ref 150–400)
RBC: 5.06 MIL/uL (ref 3.87–5.11)
RDW: 14.3 % (ref 11.5–15.5)
WBC: 15.4 10*3/uL — ABNORMAL HIGH (ref 4.0–10.5)
nRBC: 0 % (ref 0.0–0.2)

## 2019-09-03 LAB — BASIC METABOLIC PANEL
Anion gap: 10 (ref 5–15)
BUN: 10 mg/dL (ref 6–20)
CO2: 27 mmol/L (ref 22–32)
Calcium: 9.3 mg/dL (ref 8.9–10.3)
Chloride: 102 mmol/L (ref 98–111)
Creatinine, Ser: 0.53 mg/dL (ref 0.44–1.00)
GFR calc Af Amer: >60 mL/min (ref >60)
GFR calc non Af Amer: >60 mL/min (ref >60)
Glucose, Bld: 115 mg/dL — ABNORMAL HIGH (ref 70–99)
Potassium: 4.2 mmol/L (ref 3.5–5.1)
Sodium: 139 mmol/L (ref 135–145)

## 2019-09-03 LAB — TROPONIN I (HIGH SENSITIVITY)
Troponin I (High Sensitivity): 2 ng/L (ref <18)
Troponin I (High Sensitivity): 3 ng/L (ref <18)

## 2019-09-03 LAB — I-STAT BETA HCG BLOOD, ED (MC, WL, AP ONLY): I-stat hCG, quantitative: <5 m[IU]/mL (ref <5)

## 2019-09-03 MED ORDER — SODIUM CHLORIDE 0.9% FLUSH
3.0000 mL | Freq: Once | INTRAVENOUS | Status: DC
Start: 2019-09-03 — End: 2019-09-04

## 2019-09-03 NOTE — ED Triage Notes (Signed)
Pt c/o intermittent chest pain x 2 days, worsening in the last 30 minutes. Pt states pain radiates to her back, denies shortness of breath.

## 2019-09-04 LAB — D-DIMER, QUANTITATIVE: D-Dimer, Quant: <0.27 ug/mL-FEU (ref 0.00–0.50)

## 2019-09-04 MED ORDER — METHOCARBAMOL 500 MG PO TABS
500.0000 mg | ORAL_TABLET | Freq: Three times a day (TID) | ORAL | 0 refills | Status: DC | PRN
Start: 2019-09-04 — End: 2020-07-11

## 2019-09-04 MED ORDER — IBUPROFEN 800 MG PO TABS: 800.0000 mg | ORAL_TABLET | Freq: Four times a day (QID) | ORAL | 0 refills | Status: AC | PRN

## 2019-09-04 MED ORDER — HYDROCODONE-ACETAMINOPHEN 5-325 MG PO TABS
1.0000 | ORAL_TABLET | Freq: Once | ORAL | Status: AC
  Administered 2019-09-04: 1 via ORAL
  Filled 2019-09-04: qty 1

## 2019-09-04 NOTE — ED Provider Notes (Signed)
[facility] EMERGENCY DEPARTMENT Provider Note   CSN: [id number] Arrival date & time: 09/03/19  1724     History Chief Complaint  Patient presents with  . Chest Pain    [name] is a 44 y.o. female.  Patient presents to the emergency department for evaluation of chest pain.  Patient has been experiencing pain for 2 days.  Pain started as a pain at the base of the neck and upper back between the shoulder blades.  She denies injury.  This area is tender to the touch and significantly worsens when she moves.  She has now been experiencing pain into the center of her chest that feels like a squeezing sensation.  Pain does not radiate.  No nausea or diaphoresis.  This also worsens with movement as well as taking deep breaths.  She is not short of breath.        Past Medical History:  Diagnosis Date  . Abnormal Pap smear 2007   colpo, leep  . Elevated liver enzymes   . No pertinent past medical history   . Preterm labor     Patient Active Problem List   Diagnosis Date Noted  . 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 Anovulation 06/30/2013  . CIN III (cervical intraepithelial neoplasia grade III) with severe dysplasia 06/03/2012  . Menstrual migraine 06/03/2012    Past Surgical History:  Procedure Laterality Date  . BREAST BIOPSY  1996   right  breast  . CERVICAL BIOPSY  W/ LOOP ELECTRODE EXCISION    . CESAREAN SECTION  01/30/2011   Procedure: CESAREAN SECTION;  Surgeon: Oliver Pila;  Location: [facility];  Service: Gynecology;  Laterality: N/A;  Repeat with Bilateral Tubal Ligation   . CHOLECYSTECTOMY  2006  . CHOLECYSTECTOMY, LAPAROSCOPIC    . LEEP    . TUBAL LIGATION  1999   tubal reversal in 2006  . tubal ligation reversal Bilateral   . tubal reversal       OB History    Gravida  5   Para  5   Term  1   Preterm  4   AB  0   Living  5     SAB  0   TAB  0   Ectopic  0   Multiple  0   Live Births  5            Family History  Problem Relation Age of Onset  . Heart disease Mother   . COPD Mother   . Heart disease Father   . Breast cancer Maternal Grandmother   . Cirrhosis Maternal Grandmother        alcoholic  . Breast cancer Paternal Grandmother   . Alzheimer's disease Paternal Grandmother   . Colon cancer Maternal Grandfather   . Prostate cancer Maternal Grandfather   . Stomach cancer Neg Hx   . Rectal cancer Neg Hx   . Esophageal cancer Neg Hx   . Liver cancer Neg Hx     Social History   Tobacco Use  . Smoking status: Former Smoker    Quit date: 06/04/2007    Years since quitting: 12.2  . Smokeless tobacco: Never Used  Substance Use Topics  . Alcohol use: Yes    Comment: socially  . Drug use: No    Home Medications Prior to Admission medications   Medication Sig Start Date End Date Taking? Authorizing Provider  aspirin-acetaminophen-caffeine (EXCEDRIN MIGRAINE) [phone] MG tablet Take 1 tablet by mouth  every 6 (six) hours as needed for headache or migraine.    Yes [provider]  ibuprofen (ADVIL) 800 MG tablet Take 1 tablet (800 mg total) by mouth every 6 (six) hours as needed for moderate pain. 09/04/19   Orpah Greek, MD  methocarbamol (ROBAXIN) 500 MG tablet Take 1 tablet (500 mg total) by mouth every 8 (eight) hours as needed for muscle spasms. 09/04/19   Orpah Greek, MD    Allergies    Penicillins, Shellfish allergy, and Percocet [oxycodone-acetaminophen]  Review of Systems   Review of Systems  Cardiovascular: Positive for chest pain.  Musculoskeletal: Positive for back pain.  All other systems reviewed and are negative.   Physical Exam Updated Vital Signs BP (!) 104/54   Pulse 87   Temp 98.1 F (36.7 C) (Oral)   Resp 17   Ht 5\' 3"  (1.6 m)   Wt 104.3 kg   LMP 08/20/2019   SpO2 95%   BMI 40.74 kg/m   Physical Exam Vitals and nursing note reviewed.  Constitutional:      General: She is not in acute distress.     Appearance: Normal appearance. She is well-developed.  HENT:     Head: Normocephalic and atraumatic.     Right Ear: Hearing normal.     Left Ear: Hearing normal.     Nose: Nose normal.  Eyes:     Conjunctiva/sclera: Conjunctivae normal.     Pupils: Pupils are equal, round, and reactive to light.  Cardiovascular:     Rate and Rhythm: Regular rhythm.     Heart sounds: S1 normal and S2 normal. No murmur. No friction rub. No gallop.   Pulmonary:     Effort: Pulmonary effort is normal. No respiratory distress.     Breath sounds: Normal breath sounds.  Chest:     Chest wall: Tenderness present.    Abdominal:     General: Bowel sounds are normal.     Palpations: Abdomen is soft.     Tenderness: There is no abdominal tenderness. There is no guarding or rebound. Negative signs include Murphy's sign and McBurney's sign.     Hernia: No hernia is present.  Musculoskeletal:        General: Normal range of motion.     Cervical back: Normal range of motion and neck supple. Spasms and tenderness present. Pain with movement present.     Thoracic back: Tenderness present.  Skin:    General: Skin is warm and dry.     Findings: No rash.  Neurological:     Mental Status: She is alert and oriented to person, place, and time.     GCS: GCS eye subscore is 4. GCS verbal subscore is 5. GCS motor subscore is 6.     Cranial Nerves: No cranial nerve deficit.     Sensory: No sensory deficit.     Coordination: Coordination normal.  Psychiatric:        Speech: Speech normal.        Behavior: Behavior normal.        Thought Content: Thought content normal.     ED Results / Procedures / Treatments   Labs (all labs ordered are listed, but only abnormal results are displayed) Labs Reviewed  BASIC METABOLIC PANEL - Abnormal; Notable for the following components:      Result Value   Glucose, Bld 115 (*)    All other components within normal limits  CBC - Abnormal; Notable for the following  components:     WBC 15.4 (*)    MCH 25.1 (*)    All other components within normal limits  D-DIMER, QUANTITATIVE (NOT AT [facility])  I-STAT BETA HCG BLOOD, ED ([facility], [facility], [facility] ONLY)  TROPONIN I (HIGH SENSITIVITY)  TROPONIN I (HIGH SENSITIVITY)    EKG EKG Interpretation  Date/Time:  Friday September 03 2019 17:25:07 EDT Ventricular Rate:  135 PR Interval:  140 QRS Duration: 74 QT Interval:  292 QTC Calculation: 438 R Axis:   106 Text Interpretation: Sinus tachycardia Rightward axis Confirmed by Delfin Edis) on 09/04/2019 1:46:14 AM   Radiology DG Chest 2 View  Result Date: 09/03/2019 CLINICAL DATA:  Upper back pain. EXAM: CHEST - 2 VIEW COMPARISON:  None. FINDINGS: The heart size and mediastinal contours are within normal limits. Both lungs are clear. The visualized skeletal structures are unremarkable. IMPRESSION: No active cardiopulmonary disease. Electronically Signed   By: Gerome Sam III M.D   On: 09/03/2019 18:28    Procedures Procedures (including critical care time)  Medications Ordered in ED Medications  sodium chloride flush (NS) 0.9 % injection 3 mL (has no administration in time range)  HYDROcodone-acetaminophen (NORCO/VICODIN) 5-325 MG per tablet 1 tablet (1 tablet Oral Given 09/04/19 0432)    ED Course  I have reviewed the triage vital signs and the nursing notes.  Pertinent labs & imaging results that were available during my care of the patient were reviewed by me and considered in my medical decision making (see chart for details).    MDM Rules/Calculators/A&P                      Presents to the emergency department for evaluation of chest pain.  She is experiencing pain in the upper back and the base of her neck as well.  Neck and upper back pain started first and now she is having pain radiating through to the chest.  Both of these areas are tender to the touch and pain is reproducible with movement of the upper back and neck.  This is most consistent with  musculoskeletal pain.  Cardiac work-up is negative.  D-dimer is negative.  She feels much better after Vicodin.  Will treat for musculoskeletal pain.  She does have a significant family history of cardiac disease, however, and therefore will refer to cardiology for further evaluation of her chest pain.  Final Clinical Impression(s) / ED Diagnoses Final diagnoses:  Chest pain, unspecified type  Thoracic myofascial strain, initial encounter    Rx / DC Orders ED Discharge Orders         Ordered    ibuprofen (ADVIL) 800 MG tablet  Every 6 hours PRN     09/04/19 0651    methocarbamol (ROBAXIN) 500 MG tablet  Every 8 hours PRN     09/04/19 0651           Gilda Crease, MD 09/04/19 [phone]

## 2020-01-25 ENCOUNTER — Encounter: Payer: Self-pay | Admitting: Obstetrics and Gynecology

## 2020-01-25 ENCOUNTER — Ambulatory Visit (INDEPENDENT_AMBULATORY_CARE_PROVIDER_SITE_OTHER): Payer: Self-pay | Admitting: Obstetrics and Gynecology

## 2020-01-25 ENCOUNTER — Other Ambulatory Visit: Payer: Self-pay

## 2020-01-25 VITALS — BP 124/82

## 2020-01-25 DIAGNOSIS — N939 Abnormal uterine and vaginal bleeding, unspecified: Secondary | ICD-10-CM

## 2020-01-25 DIAGNOSIS — R635 Abnormal weight gain: Secondary | ICD-10-CM

## 2020-01-25 DIAGNOSIS — R7309 Other abnormal glucose: Secondary | ICD-10-CM

## 2020-01-25 DIAGNOSIS — N926 Irregular menstruation, unspecified: Secondary | ICD-10-CM

## 2020-01-25 LAB — PREGNANCY, URINE: Preg Test, Ur: NEGATIVE

## 2020-01-25 MED ORDER — LEVONORGESTREL-ETHINYL ESTRAD 0.1-20 MG-MCG PO TABS: 1.0000 | ORAL_TABLET | Freq: Every day | ORAL | 12 refills | Status: DC

## 2020-01-25 NOTE — Progress Notes (Signed)
[name] Sep 03, 1975 [id number]  SUBJECTIVE:  44 y.o. [id number] female presents for evaluation of menstrual problem, concerns about abnormal hair growth, weight gain.  She reports typically in the last few years she has had very regular 5 day periods with predictable 29 day cycles.  In the last 4 months or so she has developed quite a bit of variability in her menstrual cycle length sometimes shorter sometimes longer by up to 2 weeks.  She recently had a period that was 2 weeks late.  No problems with pelvic pain or menstrual cramping.  Previously she also had menstrual migraines but those seem to have resolved in the past several months.  Also she is is concerned about weight gain stating she has gained 80 pounds over the past few years.  She reports doing a lot of research herself and looking into trying to maintain a healthy diet and stay physically active, she has done [employer] and Zumba as examples.  She also spends about an hour on the treadmill at least 3 days/week.  Not currently doing any specific weight resistance training.  Also indicates she is busy chasing around grandchildren most of the time.  Currently using any birth control.  She is a non-smoker and denies any previous history of DVT.   Current Outpatient Medications  Medication Sig Dispense Refill  . aspirin-acetaminophen-caffeine (EXCEDRIN MIGRAINE) 250-250-65 MG tablet Take 1 tablet by mouth every 6 (six) hours as needed for headache or migraine.     Marland Kitchen ibuprofen (ADVIL) 800 MG tablet Take 1 tablet (800 mg total) by mouth every 6 (six) hours as needed for moderate pain. 20 tablet 0  . levonorgestrel-ethinyl estradiol (ALESSE) 0.1-20 MG-MCG tablet Take 1 tablet by mouth daily. 28 tablet 12  . methocarbamol (ROBAXIN) 500 MG tablet Take 1 tablet (500 mg total) by mouth every 8 (eight) hours as needed for muscle spasms. (Patient not taking: Reported on 01/25/2020) 20 tablet 0   No current facility-administered medications for this  visit.   Allergies: Penicillins, Shellfish allergy, and Percocet [oxycodone-acetaminophen]  Patient's last menstrual period was 01/14/2020.  Past medical history,surgical history, problem list, medications, allergies, family history and social history were all reviewed and documented as reviewed in the EPIC chart.  ROS: Pertinent positives and negatives as reviewed in HPI   OBJECTIVE:  BP 124/82   LMP 01/14/2020  The patient appears well, alert, oriented, in no distress. PELVIC EXAM: Bimanual exam UTERUS: uterus is normal size, shape, consistency and nontender, ADNEXA: normal adnexa in size, nontender and no masses  Chaperone: Fuller Song (DNP student) as chaperone present during the examination  ASSESSMENT:  43 y.o. [id number] here for evaluation of multiple concerns including irregularity, weight gain, abnormal hair growth  PLAN:  1.  Menstrual irregularity/hair growth We discussed this could simply be due to perimenopausal transition and hormonal fluctuation.  Not necessarily indicative of any problem.  We will check FSH/LH, estradiol, CBC, free/total testosterone, DHEA-S and TSH.  Previous tubal ligation with reversal procedure noted.  Will check pregnancy test today.  We discussed the best way to manage these abnormalities would be to start on hormonal contraception of which she does not presently have any known medical contraindication as she has a non-smoker no previous thrombotic these history.  I do see that in 2019 she had elevation of liver enzymes as she also has at other points in the past.  We will check CMP today as well.  We will follow up on the result of her  blood work and she will start the birth control pill using the Sunday start method.  Sent her a prescription for Alesse equivalent which worked well for her in the past, and she would like to take this in the standard regimen.  2.  Weight gain Also could be due to the female hormonal fluctuations that are  characteristic of perimenopause.  Apparently her weight was not taken in the office today but at her last weight check on 08/2019 she was actually 230 lbs.  At her last visit to this office 08/2017 she was 240 lbs.  At various other appointments she has been as low as 222 lbs since 2015.  Overall her weight seems to be in that range in the past 6 years or so.  At any rate, we will check a TSH, hemoglobin A1c and her glucose level on CMP.  It is noted that she had an elevated glucose level of 115 on her last CMP 08/2019, not clear if she was fasting.  She says she is fasting today.  Briefly discussed glucose imbalance/diabetes mellitus as a hospital explanation for weight gain.  We could offer nutrition counseling but she indicates she has done her own research on diet and exercise.  We strongly encouraged her to try to incorporate weight resistance in addition to maintaining the cardiovascular activity she is getting as this might also help increase her BMR and produce more weight loss for her.   She will follow-up if her symptoms of menstrual irregularity are not improving in the next several months.  In regard to the weight concerns if there are no improvements I would recommend formal consultation with nutritionist/dietitian and/or weight loss clinic.  Also, her Pap smear is up-to-date but she is in need of a routine gynecologic exam so she will plan to make an appointment for that soon.   Theresia Majors MD 01/25/20

## 2020-01-30 LAB — CBC
HCT: 38.8 % (ref 35.0–45.0)
Hemoglobin: 12.6 g/dL (ref 11.7–15.5)
MCH: 25.5 pg — ABNORMAL LOW (ref 27.0–33.0)
MCHC: 32.5 g/dL (ref 32.0–36.0)
MCV: 78.5 fL — ABNORMAL LOW (ref 80.0–100.0)
MPV: 10.2 fL (ref 7.5–12.5)
Platelets: 313 10*3/uL (ref 140–400)
RBC: 4.94 10*6/uL (ref 3.80–5.10)
RDW: 14 % (ref 11.0–15.0)
WBC: 7.5 10*3/uL (ref 3.8–10.8)

## 2020-01-30 LAB — COMPREHENSIVE METABOLIC PANEL
AG Ratio: 1.4 (calc) (ref 1.0–2.5)
ALT: 67 U/L — ABNORMAL HIGH (ref 6–29)
AST: 54 U/L — ABNORMAL HIGH (ref 10–30)
Albumin: 4.4 g/dL (ref 3.6–5.1)
Alkaline phosphatase (APISO): 75 U/L (ref 31–125)
BUN: 10 mg/dL (ref 7–25)
CO2: 24 mmol/L (ref 20–32)
Calcium: 9.2 mg/dL (ref 8.6–10.2)
Chloride: 104 mmol/L (ref 98–110)
Creat: 0.69 mg/dL (ref 0.50–1.10)
Globulin: 3.2 g/dL (calc) (ref 1.9–3.7)
Glucose, Bld: 99 mg/dL (ref 65–99)
Potassium: 4.4 mmol/L (ref 3.5–5.3)
Sodium: 139 mmol/L (ref 135–146)
Total Bilirubin: 0.6 mg/dL (ref 0.2–1.2)
Total Protein: 7.6 g/dL (ref 6.1–8.1)

## 2020-01-30 LAB — TESTOS,TOTAL,FREE AND SHBG (FEMALE)
Free Testosterone: 3.1 pg/mL (ref 0.1–6.4)
Sex Hormone Binding: 34 nmol/L (ref 17–124)
Testosterone, Total, LC-MS-MS: 32 ng/dL (ref 2–45)

## 2020-01-30 LAB — TSH: TSH: 1.65 mIU/L

## 2020-01-30 LAB — HEMOGLOBIN A1C
Hgb A1c MFr Bld: 5.6 % of total Hgb (ref <5.7)
Mean Plasma Glucose: 114 (calc)
eAG (mmol/L): 6.3 (calc)

## 2020-01-30 LAB — ESTRADIOL: Estradiol: 85 pg/mL

## 2020-01-30 LAB — FSH/LH
FSH: 5.6 m[IU]/mL
LH: 3.8 m[IU]/mL

## 2020-01-30 LAB — DHEA-SULFATE: DHEA-SO4: 99 ug/dL (ref 19–231)

## 2020-03-21 ENCOUNTER — Encounter: Payer: Self-pay | Admitting: Obstetrics and Gynecology

## 2020-06-27 ENCOUNTER — Encounter: Payer: Self-pay | Admitting: Obstetrics and Gynecology

## 2020-07-03 ENCOUNTER — Encounter: Payer: Self-pay | Admitting: Obstetrics and Gynecology

## 2020-07-04 ENCOUNTER — Ambulatory Visit: Payer: Self-pay | Admitting: Obstetrics and Gynecology

## 2020-07-11 ENCOUNTER — Encounter: Payer: Self-pay | Admitting: Obstetrics and Gynecology

## 2020-07-11 ENCOUNTER — Other Ambulatory Visit: Payer: Self-pay

## 2020-07-11 ENCOUNTER — Ambulatory Visit (INDEPENDENT_AMBULATORY_CARE_PROVIDER_SITE_OTHER): Payer: Self-pay | Admitting: Obstetrics and Gynecology

## 2020-07-11 VITALS — BP 128/78 | HR 80 | Ht 64.0 in | Wt 238.0 lb

## 2020-07-11 DIAGNOSIS — Z87410 Personal history of cervical dysplasia: Secondary | ICD-10-CM

## 2020-07-11 DIAGNOSIS — Z124 Encounter for screening for malignant neoplasm of cervix: Secondary | ICD-10-CM

## 2020-07-11 DIAGNOSIS — Z01419 Encounter for gynecological examination (general) (routine) without abnormal findings: Secondary | ICD-10-CM

## 2020-07-11 MED ORDER — LEVONORGESTREL-ETHINYL ESTRAD 0.1-20 MG-MCG PO TABS: 1.0000 | ORAL_TABLET | Freq: Every day | ORAL | 4 refills | Status: DC

## 2020-07-11 NOTE — Progress Notes (Signed)
   [name] 05-10-1976 [id number]  SUBJECTIVE:  45 y.o. [id number] female for annual routine gynecologic exam and Pap smear. She has no gynecologic concerns.   Current Outpatient Medications  Medication Sig Dispense Refill  . aspirin-acetaminophen-caffeine (EXCEDRIN MIGRAINE) 250-250-65 MG tablet Take 1 tablet by mouth every 6 (six) hours as needed for headache or migraine.     Marland Kitchen ibuprofen (ADVIL) 800 MG tablet Take 1 tablet (800 mg total) by mouth every 6 (six) hours as needed for moderate pain. 20 tablet 0  . levonorgestrel-ethinyl estradiol (ALESSE) 0.1-20 MG-MCG tablet Take 1 tablet by mouth daily. 28 tablet 12   No current facility-administered medications for this visit.   Allergies: Penicillins, Shellfish allergy, and Percocet [oxycodone-acetaminophen]  Patient's last menstrual period was 06/29/2020.  Past medical history,surgical history, problem list, medications, allergies, family history and social history were all reviewed and documented as reviewed in the EPIC chart.  ROS: Pertinent positives and negatives as reviewed in HPI  OBJECTIVE:  BP 128/78 (BP Location: Right Arm, Patient Position: Sitting, Cuff Size: Normal)   Pulse 80   Ht 5\' 4"  (1.626 m)   Wt 238 lb (108 kg)   LMP 06/29/2020   BMI 40.85 kg/m  The patient appears well, alert, oriented, in no distress. ENT normal.  Neck supple. No cervical or supraclavicular adenopathy or thyromegaly.  Lungs are clear, good air entry, no wheezes, rhonchi or rales. S1 and S2 normal, no murmurs, regular rate and rhythm.  Abdomen soft without tenderness, guarding, mass or organomegaly.  Neurological is normal, no focal findings.  BREAST EXAM: breasts appear normal, no suspicious masses, no skin or nipple changes or axillary nodes  PELVIC EXAM: VULVA: normal appearing vulva with no masses, tenderness or lesions, VAGINA: normal appearing vagina with normal color and discharge, no lesions, CERVIX: normal appearing cervix without  discharge or lesions, UTERUS: uterus is normal size, shape, consistency and nontender, ADNEXA: normal adnexa in size, nontender and no masses, PAP: Pap smear done today, thin-prep method  Chaperone: 07/01/2020 present during the examination  ASSESSMENT:  45 y.o. 54 here for annual gynecologic exam  PLAN:   1. No hormonal or menstrual concerns.   01/2020 evaluation for menstrual irregularity was essentially unremarkable.  Regular menses using Alesse equivalent. 2. Pap smear 2019.  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  Pap smear is collected today.  Reviewed screening guideline interval.   3. Contraception.  Previous tubal ligation with reversal.  Currently on Alesse equivalent and doing well.  Refill x1 year provided. 4. Breast exam normal.  Annual mammograms recommended. 5. Health maintenance.  No labs today as she had a TSH, CMP and CBC done during her recent work-up 6 months ago.  Mild elevation of LFTs had improved since previous testing.  Thought to be due to hepatic steatosis.  The patient is aware that I will only be at this practice until the end of the week so she knows to make sure she requests follow-up on any results that may return after that time.  Return annually or sooner, prn.  2008 MD 07/11/20

## 2020-07-14 LAB — PAP IG W/ RFLX HPV ASCU

## 2021-07-10 NOTE — Progress Notes (Signed)
46 y.o. G85P1405 Married Caucasian female here for annual exam.   ? ?Patient is having hot flashes. She is complaining of decreased libido. ?Her cycles are changing. Light bleeding, no significant cramping.  ? ?She stopped her birth control. ?Hx infertility.  ?Would welcome pregnancy.  ? ?Difficulty to loose weight.  ?Wants help with this.  ? ?Saw Dr. Havery Moros about elevated liver function.  ?Has fatty liver disease.  ? ?PCP:   None ? ?Patient's last menstrual period was 06/18/2021 (approximate).     ?Period Cycle (Days): 30 ?Period Duration (Days): 3 ?Period Pattern: Regular ?Menstrual Flow: Light ?Menstrual Control: Tampon ?Dysmenorrhea: None ?    ?Sexually active: Yes.    ?The current method of family planning is Tubal.    ?Exercising: Yes.     Walks 30 minutes 3-4x/week ?Smoker:  Former ? ?Health Maintenance: ?Pap:  07-11-20 Neg, 08-20-17 Neg, 12-04-15 Neg:Neg HR HPV ?History of abnormal Pap:  Yes, Hx of LEEP for CIN III in 2007. Paps normal since. ?MMG:  NEVER ?Colonoscopy:  N/A ?BMD:   N/A  Result  N/A ?TDaP:  Unsure ?Gardasil:   no ?HIV:2012 NR ?Hep C:12-06-15 Neg ?Screening Labs:   ? ? reports that she quit smoking about 14 years ago. Her smoking use included cigarettes. She has never used smokeless tobacco. She reports that she does not currently use alcohol. She reports that she does not use drugs. ? ?Past Medical History:  ?Diagnosis Date  ? Abnormal Pap smear 2007  ? colpo, leep  ? Elevated liver enzymes   ? No pertinent past medical history   ? Preterm labor   ? ? ?Past Surgical History:  ?Procedure Laterality Date  ? BREAST BIOPSY  1996  ? right  breast  ? CERVICAL BIOPSY  W/ LOOP ELECTRODE EXCISION    ? CESAREAN SECTION  01/30/2011  ? Procedure: CESAREAN SECTION;  Surgeon: Logan Bores;  Location: [facility];  Service: Gynecology;  Laterality: N/A;  Repeat with Bilateral Tubal Ligation   ? CHOLECYSTECTOMY  2006  ? CHOLECYSTECTOMY, LAPAROSCOPIC    ? LEEP    ? TUBAL LIGATION  1999  ? tubal reversal in 2006   ? tubal ligation reversal Bilateral   ? tubal reversal    ? ? ?Current Outpatient Medications  ?Medication Sig Dispense Refill  ? aspirin-acetaminophen-caffeine (EXCEDRIN MIGRAINE) 250-250-65 MG tablet Take 1 tablet by mouth every 6 (six) hours as needed for headache or migraine.     ? ibuprofen (ADVIL) 800 MG tablet Take 1 tablet (800 mg total) by mouth every 6 (six) hours as needed for moderate pain. 20 tablet 0  ? ?No current facility-administered medications for this visit.  ? ? ?Family History  ?Problem Relation Age of Onset  ? Heart disease Mother   ? COPD Mother   ? Heart disease Father   ? Breast cancer Maternal Grandmother   ? Cirrhosis Maternal Grandmother   ?     alcoholic  ? Colon cancer Maternal Grandfather   ? Prostate cancer Maternal Grandfather   ? Breast cancer Paternal Grandmother   ? Alzheimer's disease Paternal Grandmother   ? Stomach cancer Neg Hx   ? Rectal cancer Neg Hx   ? Esophageal cancer Neg Hx   ? Liver cancer Neg Hx   ? ? ?Review of Systems  ?Constitutional:  Positive for unexpected weight change (weight gain).  ?All other systems reviewed and are negative. ? ?Exam:   ?BP 122/78   Pulse 91   Ht 5' 3.5" (  1.613 m)   Wt 238 lb (108 kg)   LMP 06/18/2021 (Approximate)   SpO2 98%   BMI 41.50 kg/m?     ?General appearance: alert, cooperative and appears stated age ?Head: normocephalic, without obvious abnormality, atraumatic ?Neck: no adenopathy, supple, symmetrical, trachea midline and thyroid normal to inspection and palpation ?Lungs: clear to auscultation bilaterally ?Breasts: normal appearance, no masses or tenderness, No nipple retraction or dimpling, No nipple discharge or bleeding, No axillary adenopathy ?Heart: regular rate and rhythm ?Abdomen: soft, non-tender; no masses, no organomegaly ?Extremities: extremities normal, atraumatic, no cyanosis or edema ?Skin: skin color, texture, turgor normal. No rashes or lesions ?Lymph nodes: cervical, supraclavicular, and axillary nodes  normal. ?Neurologic: grossly normal ? ?Pelvic: External genitalia:  no lesions ?             No abnormal inguinal nodes palpated. ?             Urethra:  normal appearing urethra with no masses, tenderness or lesions ?             Bartholins and Skenes: normal    ?             Vagina: normal appearing vagina with normal color and discharge, no lesions ?             Cervix: no lesions ?             Pap taken: yes ?Bimanual Exam:  Uterus:  normal size, contour, position, consistency, mobility, non-tender ?             Adnexa: no mass, fullness, tenderness ?             Rectal exam: yes.  Confirms. ?             Anus:  normal sphincter tone, no lesions ? ?Chaperone was present for exam:  Estill Bamberg, CMA ? ?Assessment:   ?Well woman visit with gynecologic exam. ?Hx LEEP - CIN III 2007. ?Status post BTL and reversal.   ?Off COCs.  ?BMI 41.5. ?Decreased libido. ? ?Plan: ?Mammogram screening discussed.  She will schedule this. ?Self breast awareness reviewed. ?Pap and HR HPV collected. ?Guidelines for Calcium, Vitamin D, regular exercise program including cardiovascular and weight bearing exercise. ?Start PNV. ?Referral to medical weight management.  ?Labs with medical weight management or PCP.  ?We discussed testosterone treatment, Wellbutrin and more partner time together. ?I discussed colon cancer screening with patient:  either colonoscopy or Cologuard.  Written information on Cologuard. ?Follow up annually and prn.  ? ?After visit summary provided.  ? ? ? ?

## 2021-07-12 ENCOUNTER — Other Ambulatory Visit (HOSPITAL_COMMUNITY)
Admission: RE | Admit: 2021-07-12 | Discharge: 2021-07-12 | Disposition: A | Discharge status: Home / Self Care | Payer: Self-pay | Source: Ambulatory Visit | Attending: Obstetrics and Gynecology | Admitting: Obstetrics and Gynecology

## 2021-07-12 ENCOUNTER — Other Ambulatory Visit: Payer: Self-pay

## 2021-07-12 ENCOUNTER — Ambulatory Visit (INDEPENDENT_AMBULATORY_CARE_PROVIDER_SITE_OTHER): Payer: Self-pay | Admitting: Obstetrics and Gynecology

## 2021-07-12 ENCOUNTER — Encounter: Payer: Self-pay | Admitting: Obstetrics and Gynecology

## 2021-07-12 VITALS — BP 122/78 | HR 91 | Ht 63.5 in | Wt 238.0 lb

## 2021-07-12 DIAGNOSIS — Z01419 Encounter for gynecological examination (general) (routine) without abnormal findings: Secondary | ICD-10-CM

## 2021-07-12 DIAGNOSIS — Z6841 Body Mass Index (BMI) 40.0 and over, adult: Secondary | ICD-10-CM

## 2021-07-12 DIAGNOSIS — Z124 Encounter for screening for malignant neoplasm of cervix: Secondary | ICD-10-CM | POA: Insufficient documentation

## 2021-07-12 NOTE — Patient Instructions (Signed)

## 2021-07-16 LAB — CYTOLOGY - PAP
Comment: NEGATIVE
Diagnosis: NEGATIVE
High risk HPV: NEGATIVE

## 2021-09-22 IMAGING — DX DG CHEST 2V
2 series · 2 of 2 positions shown · non-contrast
Comparison: None.

CLINICAL DATA: Upper back pain.

EXAM:
CHEST - 2 VIEW

[chest pa]
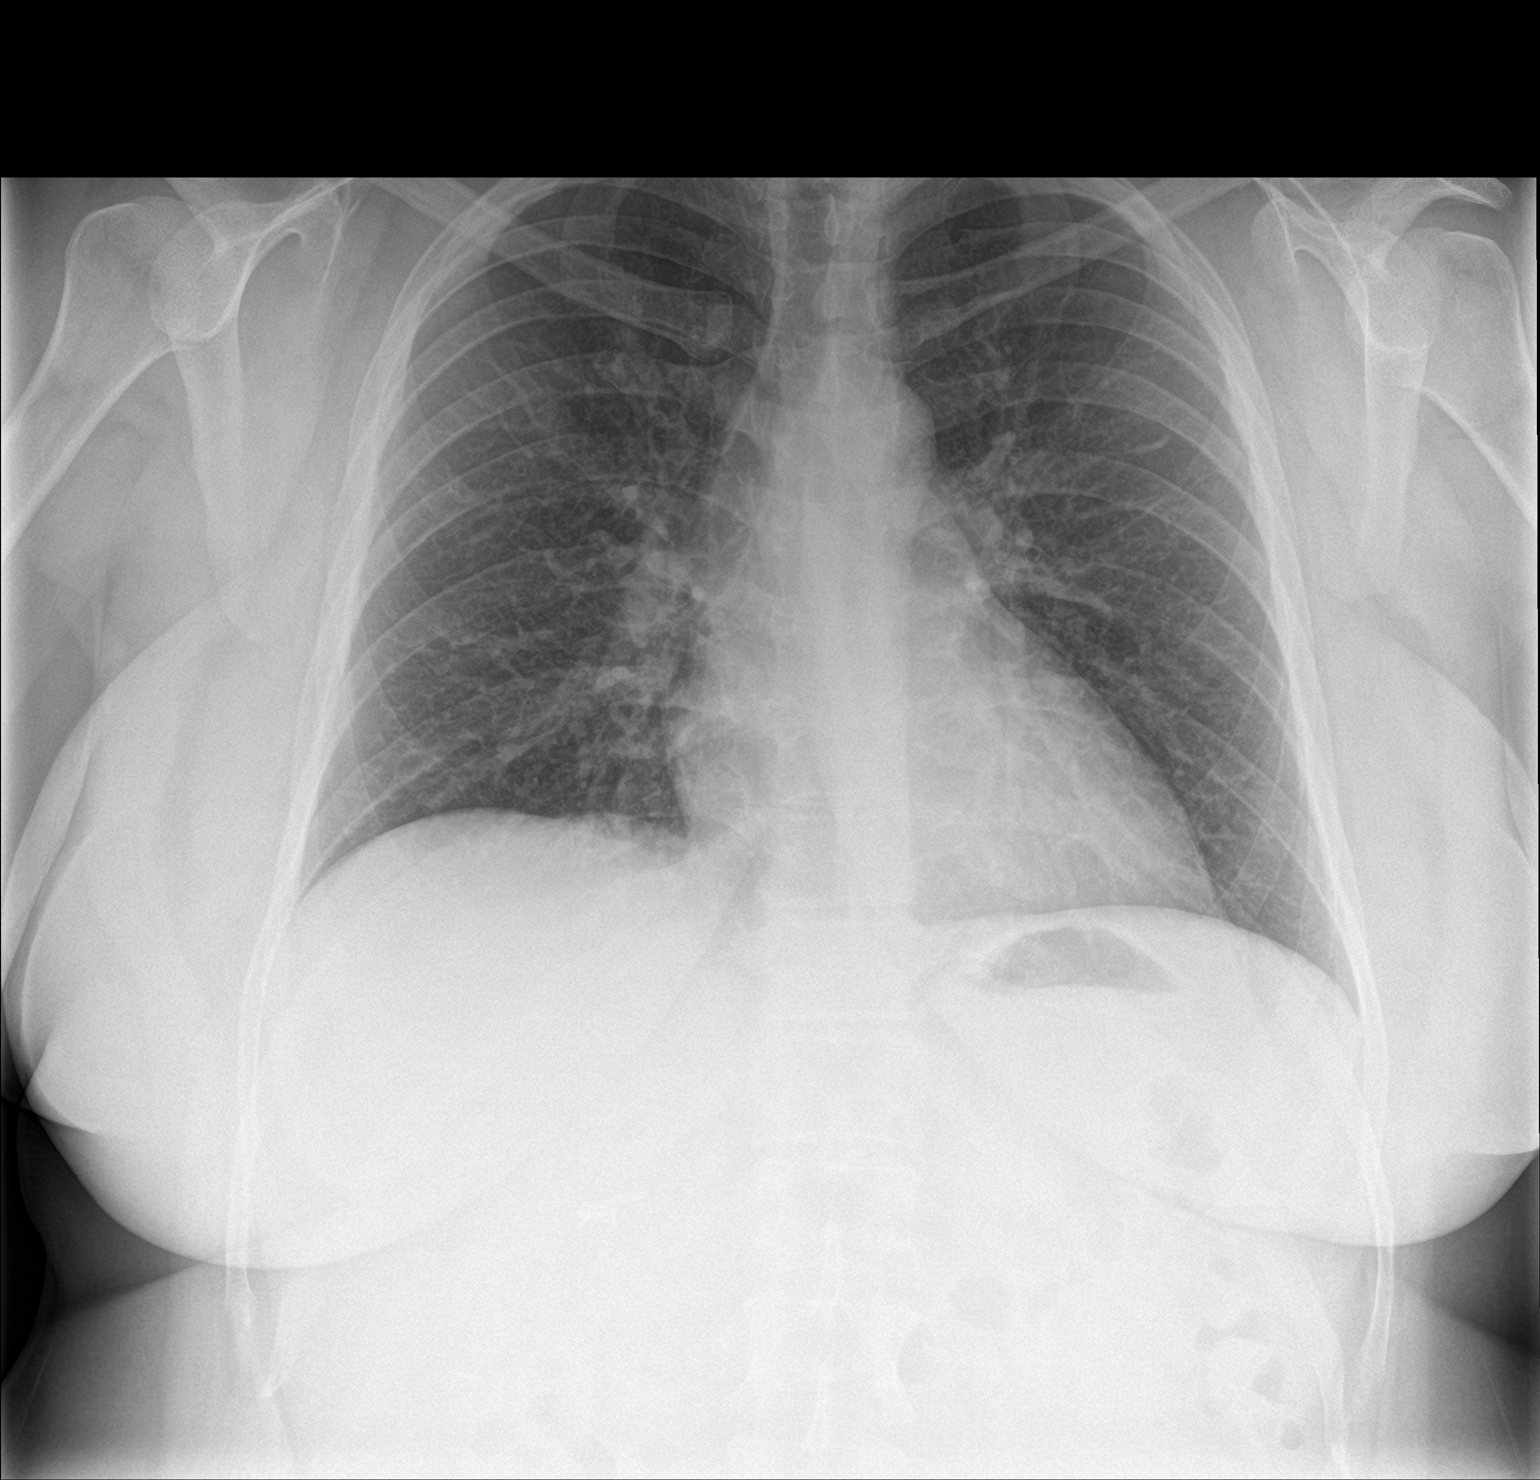

[chest lat]
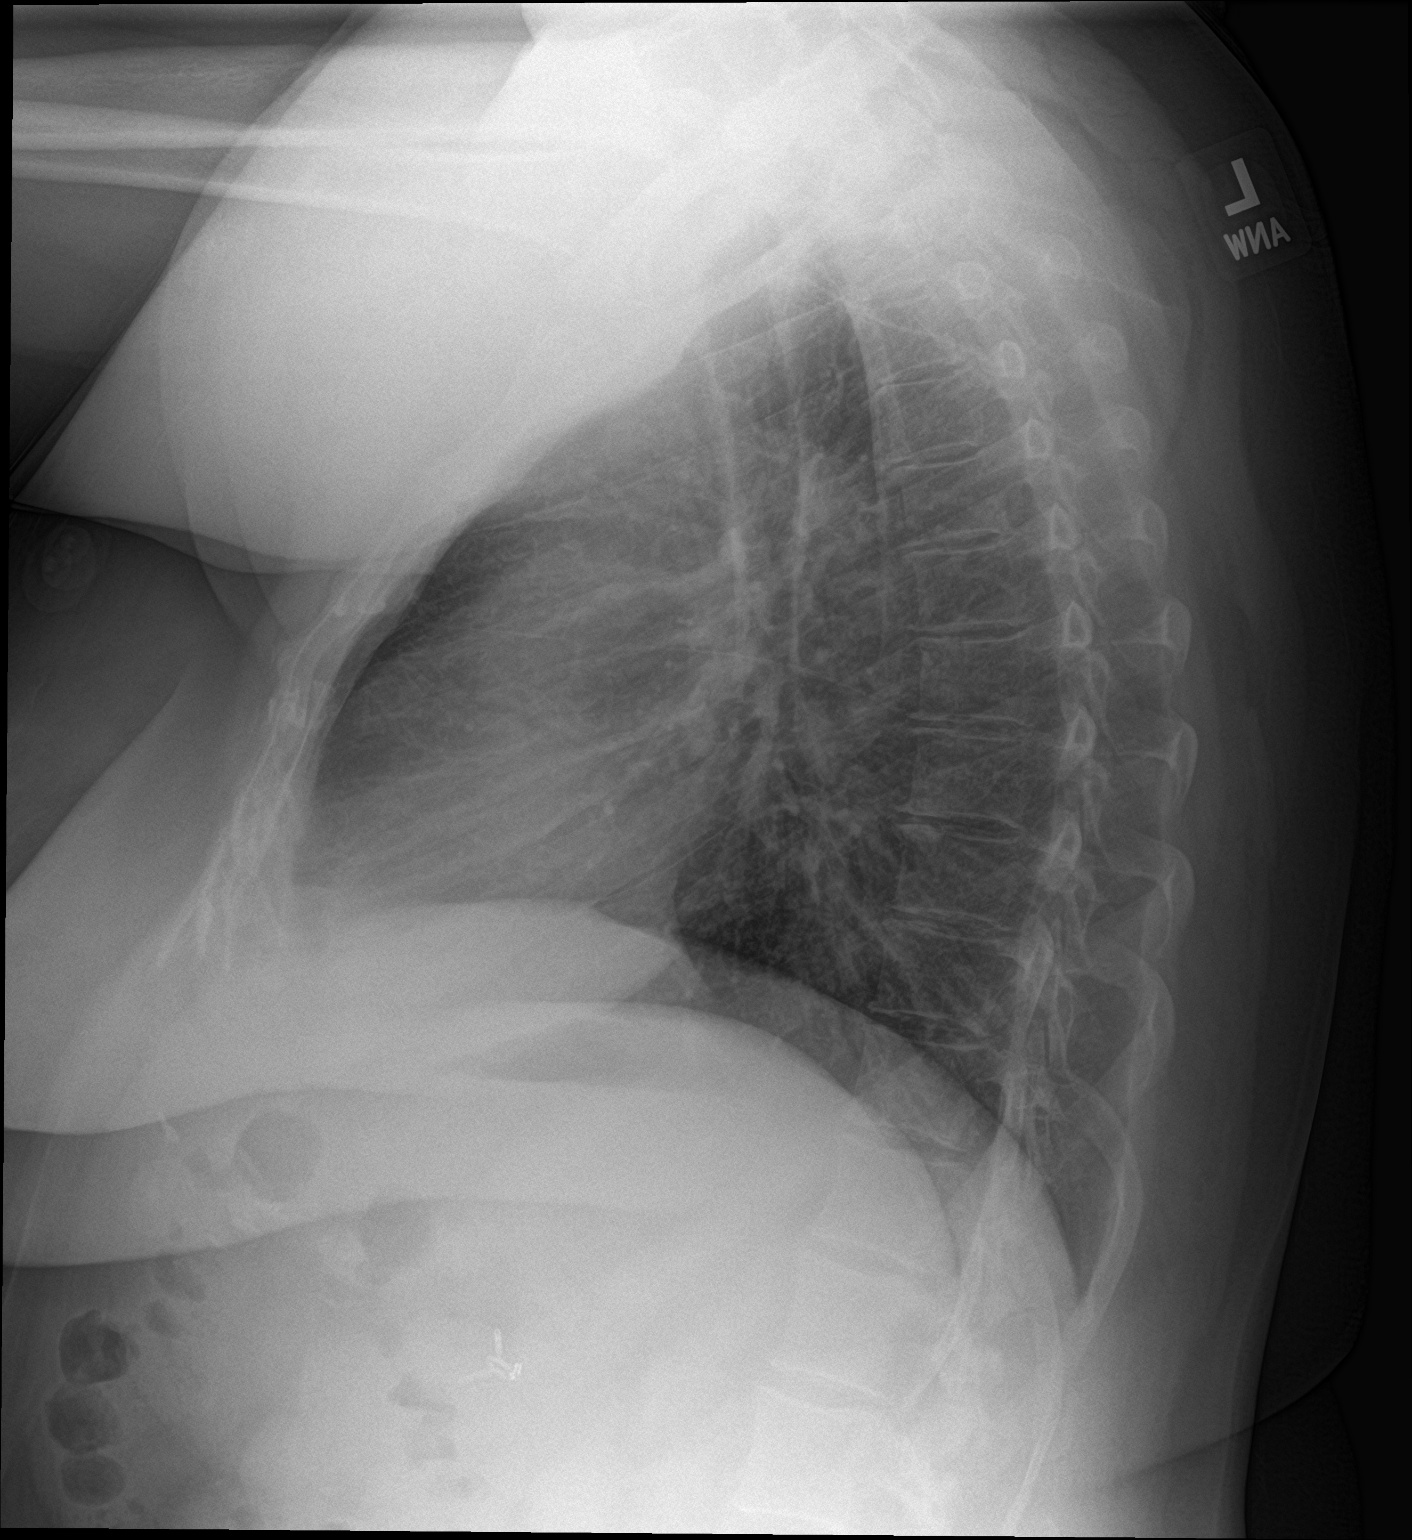

[2 of 2 positions shown; findings below may reference images not displayed]

FINDINGS: The heart size and mediastinal contours are within normal limits.
Both lungs are clear. The visualized skeletal structures are
unremarkable.
IMPRESSION: No active cardiopulmonary disease.

## 2022-07-03 NOTE — Progress Notes (Deleted)
47 y.o. G5P1405 Married Caucasian female here for annual exam.    PCP:     No LMP recorded.           Sexually active: {yes no:314532}  The current method of family planning is tubal ligation.    Exercising: {yes no:314532}  {types:19826} Smoker:  former  Health Maintenance: Pap:  07-11-20 Neg, 08-20-17 Neg, 12-04-15 Neg:Neg HR HPV  History of abnormal Pap:  yes, Hx of LEEP for CIN III in 2007. Paps normal since.  MMG:  never Colonoscopy:  n/a BMD:   n/a  Result  n/a TDaP:  unsure Gardasil:   no HIV: 2012 NR Hep C: 12/06/15 Neg Screening Labs:  Hb today: ***, Urine today: ***   reports that she quit smoking about 15 years ago. Her smoking use included cigarettes. She has never used smokeless tobacco. She reports that she does not currently use alcohol. She reports that she does not use drugs.  Past Medical History:  Diagnosis Date   Abnormal Pap smear 2007   colpo, leep   Elevated liver enzymes    No pertinent past medical history    Preterm labor     Past Surgical History:  Procedure Laterality Date   BREAST BIOPSY  1996   right  breast   CERVICAL BIOPSY  W/ LOOP ELECTRODE EXCISION     CESAREAN SECTION  01/30/2011   Procedure: CESAREAN SECTION;  Surgeon: Logan Bores;  Location: [facility];  Service: Gynecology;  Laterality: N/A;  Repeat with Bilateral Tubal Ligation    CHOLECYSTECTOMY  2006   CHOLECYSTECTOMY, LAPAROSCOPIC     LEEP     TUBAL LIGATION  1999   tubal reversal in 2006   tubal ligation reversal Bilateral    tubal reversal      Current Outpatient Medications  Medication Sig Dispense Refill   aspirin-acetaminophen-caffeine (EXCEDRIN MIGRAINE) 250-250-65 MG tablet Take 1 tablet by mouth every 6 (six) hours as needed for headache or migraine.      ibuprofen (ADVIL) 800 MG tablet Take 1 tablet (800 mg total) by mouth every 6 (six) hours as needed for moderate pain. 20 tablet 0   No current facility-administered medications for this visit.    Family  History  Problem Relation Age of Onset   Heart disease Mother    COPD Mother    Heart disease Father    Breast cancer Maternal Grandmother    Cirrhosis Maternal Grandmother        alcoholic   Colon cancer Maternal Grandfather    Prostate cancer Maternal Grandfather    Breast cancer Paternal Grandmother    Alzheimer's disease Paternal Grandmother    Stomach cancer Neg Hx    Rectal cancer Neg Hx    Esophageal cancer Neg Hx    Liver cancer Neg Hx     Review of Systems  Exam:   There were no vitals taken for this visit.    General appearance: alert, cooperative and appears stated age Head: normocephalic, without obvious abnormality, atraumatic Neck: no adenopathy, supple, symmetrical, trachea midline and thyroid normal to inspection and palpation Lungs: clear to auscultation bilaterally Breasts: normal appearance, no masses or tenderness, No nipple retraction or dimpling, No nipple discharge or bleeding, No axillary adenopathy Heart: regular rate and rhythm Abdomen: soft, non-tender; no masses, no organomegaly Extremities: extremities normal, atraumatic, no cyanosis or edema Skin: skin color, texture, turgor normal. No rashes or lesions Lymph nodes: cervical, supraclavicular, and axillary nodes normal. Neurologic: grossly normal  Pelvic: External genitalia:  no lesions              No abnormal inguinal nodes palpated.              Urethra:  normal appearing urethra with no masses, tenderness or lesions              Bartholins and Skenes: normal                 Vagina: normal appearing vagina with normal color and discharge, no lesions              Cervix: no lesions              Pap taken: {yes no:314532} Bimanual Exam:  Uterus:  normal size, contour, position, consistency, mobility, non-tender              Adnexa: no mass, fullness, tenderness              Rectal exam: {yes no:314532}.  Confirms.              Anus:  normal sphincter tone, no lesions  Chaperone was present  for exam:  ***  Assessment:   Well woman visit with gynecologic exam.   Plan: Mammogram screening discussed. Self breast awareness reviewed. Pap and HR HPV as above. Guidelines for Calcium, Vitamin D, regular exercise program including cardiovascular and weight bearing exercise.   Follow up annually and prn.   Additional counseling given.  {yes [id number]. _______ minutes face to face time of which over 50% was spent in counseling.    After visit summary provided.

## 2022-07-17 ENCOUNTER — Ambulatory Visit: Payer: Self-pay | Admitting: Obstetrics and Gynecology
